# Patient Record
Sex: Female | Born: 1990 | Race: Black or African American | Hispanic: No | Marital: Single | Smoking: Former smoker
Health system: Southern US, Community
[De-identification: ages and names within clinical notes are randomized; demographics above are authoritative.]

## PROBLEM LIST (undated history)

## (undated) ENCOUNTER — Inpatient Hospital Stay (HOSPITAL_COMMUNITY): Payer: Self-pay

## (undated) DIAGNOSIS — O139 Gestational [pregnancy-induced] hypertension without significant proteinuria, unspecified trimester: Secondary | ICD-10-CM

## (undated) DIAGNOSIS — E119 Type 2 diabetes mellitus without complications: Secondary | ICD-10-CM

## (undated) DIAGNOSIS — M069 Rheumatoid arthritis, unspecified: Secondary | ICD-10-CM

## (undated) HISTORY — DX: Rheumatoid arthritis, unspecified: M06.9

---

## 2011-05-03 DIAGNOSIS — O139 Gestational [pregnancy-induced] hypertension without significant proteinuria, unspecified trimester: Secondary | ICD-10-CM

## 2011-05-03 HISTORY — DX: Gestational (pregnancy-induced) hypertension without significant proteinuria, unspecified trimester: O13.9

## 2013-08-24 ENCOUNTER — Encounter (HOSPITAL_COMMUNITY): Payer: Self-pay | Admitting: *Deleted

## 2013-08-24 ENCOUNTER — Inpatient Hospital Stay (HOSPITAL_COMMUNITY)
Admission: AD | Admit: 2013-08-24 | Discharge: 2013-08-24 | Disposition: A | Discharge status: Home / Self Care | Payer: Medicaid Other | Source: Ambulatory Visit | Attending: Obstetrics and Gynecology | Admitting: Obstetrics and Gynecology

## 2013-08-24 DIAGNOSIS — R109 Unspecified abdominal pain: Secondary | ICD-10-CM | POA: Insufficient documentation

## 2013-08-24 DIAGNOSIS — B373 Candidiasis of vulva and vagina: Secondary | ICD-10-CM

## 2013-08-24 DIAGNOSIS — O24012 Pre-existing diabetes mellitus, type 1, in pregnancy, second trimester: Secondary | ICD-10-CM

## 2013-08-24 DIAGNOSIS — O2390 Unspecified genitourinary tract infection in pregnancy, unspecified trimester: Secondary | ICD-10-CM | POA: Insufficient documentation

## 2013-08-24 DIAGNOSIS — E109 Type 1 diabetes mellitus without complications: Secondary | ICD-10-CM | POA: Insufficient documentation

## 2013-08-24 DIAGNOSIS — B3731 Acute candidiasis of vulva and vagina: Secondary | ICD-10-CM

## 2013-08-24 DIAGNOSIS — O24919 Unspecified diabetes mellitus in pregnancy, unspecified trimester: Secondary | ICD-10-CM | POA: Insufficient documentation

## 2013-08-24 HISTORY — DX: Type 2 diabetes mellitus without complications: E11.9

## 2013-08-24 HISTORY — DX: Gestational (pregnancy-induced) hypertension without significant proteinuria, unspecified trimester: O13.9

## 2013-08-24 LAB — URINALYSIS, ROUTINE W REFLEX MICROSCOPIC
Bilirubin Urine: NEGATIVE
GLUCOSE, UA: NEGATIVE mg/dL
HGB URINE DIPSTICK: NEGATIVE
Ketones, ur: NEGATIVE mg/dL
Nitrite: NEGATIVE
Protein, ur: NEGATIVE mg/dL
Urobilinogen, UA: 0.2 mg/dL (ref 0.0–1.0)
pH: 5.5 (ref 5.0–8.0)

## 2013-08-24 LAB — WET PREP, GENITAL
CLUE CELLS WET PREP: NONE SEEN
Trich, Wet Prep: NONE SEEN
Yeast Wet Prep HPF POC: NONE SEEN

## 2013-08-24 LAB — URINE MICROSCOPIC-ADD ON

## 2013-08-24 MED ORDER — TERCONAZOLE 0.4 % VA CREA: 1.0000 | TOPICAL_CREAM | Freq: Every day | VAGINAL | Status: DC

## 2013-08-24 MED ORDER — FLUCONAZOLE 150 MG PO TABS
150.0000 mg | ORAL_TABLET | Freq: Once | ORAL | Status: AC
  Administered 2013-08-24: 150 mg via ORAL
  Filled 2013-08-24: qty 1

## 2013-08-24 NOTE — Discharge Instructions (Signed)
Monilial Vaginitis Vaginitis in a soreness, swelling and redness (inflammation) of the vagina and vulva. Monilial vaginitis is not a sexually transmitted infection. CAUSES  Yeast vaginitis is caused by yeast (candida) that is normally found in your vagina. With a yeast infection, the candida has overgrown in number to a point that upsets the chemical balance. SYMPTOMS   White, thick vaginal discharge.  Swelling, itching, redness and irritation of the vagina and possibly the lips of the vagina (vulva).  Burning or painful urination.  Painful intercourse. DIAGNOSIS  Things that may contribute to monilial vaginitis are:  Postmenopausal and virginal states.  Pregnancy.  Infections.  Being tired, sick or stressed, especially if you had monilial vaginitis in the past.  Diabetes. Good control will help lower the chance.  Birth control pills.  Tight fitting garments.  Using bubble bath, feminine sprays, douches or deodorant tampons.  Taking certain medications that kill germs (antibiotics).  Sporadic recurrence can occur if you become ill. TREATMENT  Your caregiver will give you medication.  There are several kinds of anti monilial vaginal creams and suppositories specific for monilial vaginitis. For recurrent yeast infections, use a suppository or cream in the vagina 2 times a week, or as directed.  Anti-monilial or steroid cream for the itching or irritation of the vulva may also be used. Get your caregiver's permission.  Painting the vagina with methylene blue solution may help if the monilial cream does not work.  Eating yogurt may help prevent monilial vaginitis. HOME CARE INSTRUCTIONS   Finish all medication as prescribed.  Do not have sex until treatment is completed or after your caregiver tells you it is okay.  Take warm sitz baths.  Do not douche.  Do not use tampons, especially scented ones.  Wear cotton underwear.  Avoid tight pants and panty  hose.  Tell your sexual partner that you have a yeast infection. They should go to their caregiver if they have symptoms such as mild rash or itching.  Your sexual partner should be treated as well if your infection is difficult to eliminate.  Practice safer sex. Use condoms.  Some vaginal medications cause latex condoms to fail. Vaginal medications that harm condoms are:  Cleocin cream.  Butoconazole (Femstat).  Terconazole (Terazol) vaginal suppository.  Miconazole (Monistat) (may be purchased over the counter). SEEK MEDICAL CARE IF:   You have a temperature by mouth above 102 F (38.9 C).  The infection is getting worse after 2 days of treatment.  The infection is not getting better after 3 days of treatment.  You develop blisters in or around your vagina.  You develop vaginal bleeding, and it is not your menstrual period.  You have pain when you urinate.  You develop intestinal problems.  You have pain with sexual intercourse. Document Released: 01/14/2005 Document Revised: 06/29/2011 Document Reviewed: 09/28/2008 [facility] Patient Information 2014 [place], [place].  Abdominal Pain During Pregnancy Abdominal pain is common in pregnancy. Most of the time, it does not cause harm. There are many causes of abdominal pain. Some causes are more serious than others. Some of the causes of abdominal pain in pregnancy are easily diagnosed. Occasionally, the diagnosis takes time to understand. Other times, the cause is not determined. Abdominal pain can be a sign that something is very wrong with the pregnancy, or the pain may have nothing to do with the pregnancy at all. For this reason, always tell your health care provider if you have any abdominal discomfort. HOME CARE INSTRUCTIONS  Monitor your  abdominal pain for any changes. The following actions may help to alleviate any discomfort you are experiencing:  Do not have sexual intercourse or put anything in your vagina until  your symptoms go away completely.  Get plenty of rest until your pain improves.  Drink clear fluids if you feel nauseous. Avoid solid food as long as you are uncomfortable or nauseous.  Only take over-the-counter or prescription medicine as directed by your health care provider.  Keep all follow-up appointments with your health care provider. SEEK IMMEDIATE MEDICAL CARE IF:  You are bleeding, leaking fluid, or passing tissue from the vagina.  You have increasing pain or cramping.  You have persistent vomiting.  You have painful or bloody urination.  You have a fever.  You notice a decrease in your baby's movements.  You have extreme weakness or feel faint.  You have shortness of breath, with or without abdominal pain.  You develop a severe headache with abdominal pain.  You have abnormal vaginal discharge with abdominal pain.  You have persistent diarrhea.  You have abdominal pain that continues even after rest, or gets worse. MAKE SURE YOU:   Understand these instructions.  Will watch your condition.  Will get help right away if you are not doing well or get worse. Document Released: 04/06/2005 Document Revised: 01/25/2013 Document Reviewed: 11/03/2012 [facility] Patient Information 2014 [place], [place].

## 2013-08-24 NOTE — MAU Note (Addendum)
Pt reports having abd pain since this morning. Reports some vaginal burinng an irritation as well. Denies discharge. Reports having diarrhea x 2 days.

## 2013-08-24 NOTE — MAU Provider Note (Signed)
Chief Complaint: Abdominal Pain   First Provider Initiated Contact with Patient 08/24/13 1317     SUBJECTIVE HPI: Rebecca Mann is a 23 y.o. G2P0101 at [redacted]w[redacted]d by LMP who presents to maternity admissions reporting constant abdominal pain described as lower abdomen, like menstrual cramps, and vaginal itching x2 days.  She reports daily nausea with some vomiting, but this is unchanged.  She started care at [facility] but is transferring to [facility] for Type I GDM.  She reports her glucose has been well controlled at home.  She reports fetal movement, denies LOF, vaginal bleeding, vaginal itching/burning, urinary symptoms, h/a, dizziness, or fever/chills.     Past Medical History  Diagnosis Date  . Pregnancy induced hypertension   . Diabetes mellitus without complication    Past Surgical History  Procedure Laterality Date  . Cesarean section     History   Social History  . Marital Status: Single    Spouse Name: N/A    Number of Children: N/A  . Years of Education: N/A   Occupational History  . Not on file.   Social History Main Topics  . Smoking status: Never Smoker   . Smokeless tobacco: Not on file  . Alcohol Use: No  . Drug Use: No  . Sexual Activity: Yes    Birth Control/ Protection: None   Other Topics Concern  . Not on file   Social History Narrative  . No narrative on file   No current facility-administered medications on file prior to encounter.   No current outpatient prescriptions on file prior to encounter.   No Known Allergies  ROS: Pertinent items in HPI  OBJECTIVE Blood pressure 110/74, pulse 71, temperature 98.3 F (36.8 C), temperature source Oral, resp. rate 18, height 5\' 2"  (1.575 m), weight 61.236 kg (135 lb). GENERAL: Well-developed, well-nourished female in no acute distress.  HEENT: Normocephalic HEART: normal rate RESP: normal effort ABDOMEN: Soft, non-tender EXTREMITIES: Nontender, no edema NEURO: Alert and oriented Pelvic exam: Cervix pink,  visually closed, without lesion, large amount thick white discharge clinging to cervix and vaginal walls, vaginal walls and external genitalia with mild erythema, tender to exam Bimanual exam: Cervix 0/long/high, firm, anterior, neg CMT, uterus nontender, fundus 1cm below umbilicus, adnexa without tenderness, enlargement, or mass  FHT 172 by doppler  LAB RESULTS Results for orders placed during the hospital encounter of 08/24/13 (from the past 24 hour(s))  URINALYSIS, ROUTINE W REFLEX MICROSCOPIC     Status: Abnormal   Collection Time    08/24/13 12:23 PM      Result Value Ref Range   Color, Urine YELLOW  YELLOW   APPearance CLEAR  CLEAR   Specific Gravity, Urine <1.005 (*) 1.005 - 1.030   pH 5.5  5.0 - 8.0   Glucose, UA NEGATIVE  NEGATIVE mg/dL   Hgb urine dipstick NEGATIVE  NEGATIVE   Bilirubin Urine NEGATIVE  NEGATIVE   Ketones, ur NEGATIVE  NEGATIVE mg/dL   Protein, ur NEGATIVE  NEGATIVE mg/dL   Urobilinogen, UA 0.2  0.0 - 1.0 mg/dL   Nitrite NEGATIVE  NEGATIVE   Leukocytes, UA SMALL (*) NEGATIVE  URINE MICROSCOPIC-ADD ON     Status: Abnormal   Collection Time    08/24/13 12:23 PM      Result Value Ref Range   Squamous Epithelial / LPF FEW (*) RARE   WBC, UA 3-6  <3 WBC/hpf   RBC / HPF 0-2  <3 RBC/hpf  WET PREP, GENITAL     Status: Abnormal  Collection Time    08/24/13  1:36 PM      Result Value Ref Range   Yeast Wet Prep HPF POC NONE SEEN  NONE SEEN   Trich, Wet Prep NONE SEEN  NONE SEEN   Clue Cells Wet Prep HPF POC NONE SEEN  NONE SEEN   WBC, Wet Prep HPF POC MANY (*) NONE SEEN   ASSESSMENT 1. Vaginal candidiasis   2. Type 1 diabetes mellitus complicating pregnancy in second trimester, antepartum     PLAN Diflucan 150 mg x1 dose in MAU Discharge home Terazol 7 nightly x7 nights Rx sent to pharmacy F/U with [facility] for transfer to [facility].  Pt given contact info for [facility] if needed. Return to MAU as needed for emergencies    Medication List         insulin  glargine 100 UNIT/ML injection  Commonly known as:  LANTUS  Inject 45 Units into the skin at bedtime.     insulin lispro 100 UNIT/ML injection  Commonly known as:  HUMALOG  Inject 15 Units into the skin 3 (three) times daily before meals.     OVER THE COUNTER MEDICATION  Place 2 drops into both ears daily as needed (pain).     prenatal multivitamin Tabs tablet  Take 1 tablet by mouth daily at 12 noon.     terconazole 0.4 % vaginal cream  Commonly known as:  TERAZOL 7  Place 1 applicator vaginally at bedtime.       Follow-up Information   Follow up with [facility]. (Your provider is transferring your care to the [facility] or call the number below to set up appointment.   )    Specialty:  Obstetrics and Gynecology   Contact information:   [address] [place] [address] [phone]      Follow up with THE [facility] [place] [facility]TR[facility]. (Ultrasound will call you to schedule appointment or call the number below.  Return to MAU as needed for emergencies.)    Specialty:  Radiology   Contact information:   [address] [id number] [place] [address] [phone]      Sharen Counter Certified Nurse-Midwife 08/24/2013  2:27 PM

## 2013-08-25 LAB — GC/CHLAMYDIA PROBE AMP
CT Probe RNA: NEGATIVE
GC PROBE AMP APTIMA: NEGATIVE

## 2013-08-25 LAB — URINE CULTURE
COLONY COUNT: NO GROWTH
Culture: NO GROWTH

## 2013-08-25 NOTE — MAU Provider Note (Signed)
Attestation of Attending Supervision of Advanced Practitioner (CNM/NP): Evaluation and management procedures were performed by the Advanced Practitioner under my supervision and collaboration.  I have reviewed the Advanced Practitioner's note and chart, and I agree with the management and plan.  Tyger Wichman 08/25/2013 6:21 AM

## 2013-08-28 ENCOUNTER — Other Ambulatory Visit (HOSPITAL_COMMUNITY): Payer: Self-pay | Admitting: Physician Assistant

## 2013-08-28 DIAGNOSIS — Z3689 Encounter for other specified antenatal screening: Secondary | ICD-10-CM

## 2013-08-28 LAB — OB RESULTS CONSOLE VARICELLA ZOSTER ANTIBODY, IGG: Varicella: IMMUNE

## 2013-08-28 LAB — OB RESULTS CONSOLE PLATELET COUNT: Platelets: 234 10*3/uL

## 2013-08-28 LAB — OB RESULTS CONSOLE ANTIBODY SCREEN: Antibody Screen: NEGATIVE

## 2013-08-28 LAB — OB RESULTS CONSOLE GC/CHLAMYDIA
Chlamydia: NEGATIVE
GC PROBE AMP, GENITAL: NEGATIVE

## 2013-08-28 LAB — OB RESULTS CONSOLE ABO/RH: RH TYPE: POSITIVE

## 2013-08-28 LAB — OB RESULTS CONSOLE HIV ANTIBODY (ROUTINE TESTING): HIV: NONREACTIVE

## 2013-08-28 LAB — OB RESULTS CONSOLE HEPATITIS B SURFACE ANTIGEN: Hepatitis B Surface Ag: NEGATIVE

## 2013-08-28 LAB — CYTOLOGY - PAP: PAP SMEAR: NEGATIVE

## 2013-08-28 LAB — CYSTIC FIBROSIS DIAGNOSTIC STUDY: INTERPRETATION-CFDNA: NEGATIVE

## 2013-08-28 LAB — OB RESULTS CONSOLE HGB/HCT, BLOOD
HEMATOCRIT: 39 %
Hemoglobin: 12.3 g/dL

## 2013-08-28 LAB — OB RESULTS CONSOLE RUBELLA ANTIBODY, IGM: Rubella: IMMUNE

## 2013-08-28 LAB — OB RESULTS CONSOLE RPR: RPR: NONREACTIVE

## 2013-09-01 ENCOUNTER — Ambulatory Visit (HOSPITAL_COMMUNITY)
Admission: RE | Admit: 2013-09-01 | Discharge: 2013-09-01 | Disposition: A | Discharge status: Home / Self Care | Payer: Medicaid Other | Source: Ambulatory Visit | Attending: Physician Assistant | Admitting: Physician Assistant

## 2013-09-01 ENCOUNTER — Other Ambulatory Visit (HOSPITAL_COMMUNITY): Payer: Self-pay | Admitting: Physician Assistant

## 2013-09-01 DIAGNOSIS — Z3689 Encounter for other specified antenatal screening: Secondary | ICD-10-CM

## 2013-09-04 ENCOUNTER — Encounter: Payer: Self-pay | Admitting: Obstetrics & Gynecology

## 2013-09-04 ENCOUNTER — Telehealth: Payer: Self-pay | Admitting: Obstetrics & Gynecology

## 2013-09-04 NOTE — Telephone Encounter (Signed)
Called patient, but her mother answered the phone and said she was sleep. Mother will have patient call office when she wakes up.

## 2013-09-13 ENCOUNTER — Encounter: Payer: Self-pay | Admitting: *Deleted

## 2013-09-14 DIAGNOSIS — Z361 Encounter for antenatal screening for raised alphafetoprotein level: Secondary | ICD-10-CM | POA: Insufficient documentation

## 2013-09-15 ENCOUNTER — Encounter: Payer: Self-pay | Admitting: General Practice

## 2013-09-18 ENCOUNTER — Encounter: Payer: Medicaid Other | Attending: Obstetrics and Gynecology | Admitting: *Deleted

## 2013-09-18 ENCOUNTER — Ambulatory Visit (INDEPENDENT_AMBULATORY_CARE_PROVIDER_SITE_OTHER): Payer: Medicaid Other | Admitting: Obstetrics and Gynecology

## 2013-09-18 ENCOUNTER — Encounter: Payer: Self-pay | Admitting: Obstetrics and Gynecology

## 2013-09-18 VITALS — BP 99/60 | HR 88 | Temp 98.2°F | Ht 60.0 in | Wt 140.1 lb

## 2013-09-18 DIAGNOSIS — E119 Type 2 diabetes mellitus without complications: Secondary | ICD-10-CM

## 2013-09-18 DIAGNOSIS — O24919 Unspecified diabetes mellitus in pregnancy, unspecified trimester: Secondary | ICD-10-CM

## 2013-09-18 DIAGNOSIS — Z713 Dietary counseling and surveillance: Secondary | ICD-10-CM | POA: Diagnosis not present

## 2013-09-18 DIAGNOSIS — O0990 Supervision of high risk pregnancy, unspecified, unspecified trimester: Secondary | ICD-10-CM

## 2013-09-18 DIAGNOSIS — O09299 Supervision of pregnancy with other poor reproductive or obstetric history, unspecified trimester: Secondary | ICD-10-CM

## 2013-09-18 DIAGNOSIS — M069 Rheumatoid arthritis, unspecified: Secondary | ICD-10-CM | POA: Insufficient documentation

## 2013-09-18 DIAGNOSIS — O24119 Pre-existing diabetes mellitus, type 2, in pregnancy, unspecified trimester: Secondary | ICD-10-CM

## 2013-09-18 DIAGNOSIS — E109 Type 1 diabetes mellitus without complications: Secondary | ICD-10-CM | POA: Insufficient documentation

## 2013-09-18 DIAGNOSIS — O24019 Pre-existing diabetes mellitus, type 1, in pregnancy, unspecified trimester: Secondary | ICD-10-CM | POA: Insufficient documentation

## 2013-09-18 DIAGNOSIS — O34219 Maternal care for unspecified type scar from previous cesarean delivery: Secondary | ICD-10-CM

## 2013-09-18 DIAGNOSIS — Z23 Encounter for immunization: Secondary | ICD-10-CM

## 2013-09-18 LAB — COMPREHENSIVE METABOLIC PANEL
ALT: 10 U/L (ref 0–35)
AST: 11 U/L (ref 0–37)
Albumin: 3.8 g/dL (ref 3.5–5.2)
Alkaline Phosphatase: 45 U/L (ref 39–117)
BILIRUBIN TOTAL: 0.3 mg/dL (ref 0.2–1.2)
BUN: 6 mg/dL (ref 6–23)
CO2: 19 mEq/L (ref 19–32)
Calcium: 9.4 mg/dL (ref 8.4–10.5)
Chloride: 104 mEq/L (ref 96–112)
Creat: 0.43 mg/dL — ABNORMAL LOW (ref 0.50–1.10)
GLUCOSE: 127 mg/dL — AB (ref 70–99)
Potassium: 4.1 mEq/L (ref 3.5–5.3)
Sodium: 134 mEq/L — ABNORMAL LOW (ref 135–145)
Total Protein: 6.4 g/dL (ref 6.0–8.3)

## 2013-09-18 LAB — CBC
HCT: 35.5 % — ABNORMAL LOW (ref 36.0–46.0)
HEMOGLOBIN: 11.7 g/dL — AB (ref 12.0–15.0)
MCH: 22.2 pg — ABNORMAL LOW (ref 26.0–34.0)
MCHC: 33 g/dL (ref 30.0–36.0)
MCV: 67.2 fL — AB (ref 78.0–100.0)
PLATELETS: 252 10*3/uL (ref 150–400)
RBC: 5.28 MIL/uL — AB (ref 3.87–5.11)
RDW: 15.7 % — ABNORMAL HIGH (ref 11.5–15.5)
WBC: 8.2 10*3/uL (ref 4.0–10.5)

## 2013-09-18 LAB — POCT URINALYSIS DIP (DEVICE)
Bilirubin Urine: NEGATIVE
Glucose, UA: 250 mg/dL — AB
Hgb urine dipstick: NEGATIVE
KETONES UR: NEGATIVE mg/dL
LEUKOCYTES UA: NEGATIVE
Nitrite: NEGATIVE
PH: 7 (ref 5.0–8.0)
Protein, ur: NEGATIVE mg/dL
Specific Gravity, Urine: 1.02 (ref 1.005–1.030)
Urobilinogen, UA: 0.2 mg/dL (ref 0.0–1.0)

## 2013-09-18 LAB — TSH: TSH: 2.776 u[IU]/mL (ref 0.350–4.500)

## 2013-09-18 MED ORDER — ACCU-CHEK FASTCLIX LANCETS MISC: 1.0000 | Freq: Four times a day (QID) | Status: AC

## 2013-09-18 MED ORDER — ASPIRIN 81 MG PO TABS: 81.0000 mg | ORAL_TABLET | Freq: Every day | ORAL | Status: AC

## 2013-09-18 MED ORDER — INSULIN GLARGINE 100 UNIT/ML ~~LOC~~ SOLN: 30.0000 [IU] | Freq: Every day | SUBCUTANEOUS | Status: DC

## 2013-09-18 MED ORDER — GLUCOSE BLOOD VI STRP: ORAL_STRIP | Status: DC

## 2013-09-18 NOTE — Progress Notes (Signed)
[facility]DM [facility] 2 years. Patient moved from [place] a few weeks ago. Prenatal care complicated by previous c-section as well- Patient is interested in [facility]. C-section at 34 weeks secondary to preeclampsia

## 2013-09-18 NOTE — Progress Notes (Signed)
DIABETES: Patient DX T1DM at age of 23yo. Is on Lantus 30 units HS and Novolog 15units with breakfast, lunch, dinner. [place] Previously utilizing Contour glucometer. Dispensed Accu-ck Nano Lot: [id number], Exp: 07/19/14. Will provide order for supplies.

## 2013-09-18 NOTE — Progress Notes (Signed)
Nutrition note: 1st visit consult/ DM education  Pt has Type 1 DM Pt has gained 10.1# @ 23w, which is wnl. Pt reports eating 5 meals & 3 snacks/d. Pt has been checking blood sugars. Pt is taking a PNV. Pt reports having nausea and heartburn occ. Pt is walking most days. Pt received verbal & written review on DM diet during pregnancy.  Discussed wt gain goals of 25-35# or 1#/wk. Pt agrees to follow DM diet with 3 meals & 3 snacks/d with proper CHO/ protein combination. Pt has WIC & plans to BF. F/u in 4-6 wks Blondell Reveal, MS, RD, LDN

## 2013-09-18 NOTE — Progress Notes (Deleted)
Nutrition note: 1st visit/ DM education Pt has Type 1 DM

## 2013-09-18 NOTE — Progress Notes (Signed)
CBG fasting 48-108  Pp 65-169 (majority within range) on Lantus and Humolog regimen. Patient is not interested in changing regimen at this time as her sugars are well controlled. Will obtain baseline labs and schedule fetal echo, ophthalmology . Patient to meet with SW and nutritionist. Patient with h/o preeclampsia delivered at 34 weeks via c-section- will obtain op note. Recommended to start daily baby aspirin

## 2013-09-18 NOTE — Progress Notes (Signed)
Given new patient booklets.

## 2013-09-18 NOTE — Addendum Note (Signed)
Addended by: Rosendo Gros on: 09/18/2013 10:45 AM   Modules accepted: Orders

## 2013-09-21 LAB — PROTEIN, URINE, 24 HOUR
Protein, 24H Urine: 26 mg/d — ABNORMAL LOW (ref 50–100)
Protein, Urine: 4 mg/dL

## 2013-09-26 ENCOUNTER — Telehealth: Payer: Self-pay | Admitting: *Deleted

## 2013-09-26 DIAGNOSIS — O24119 Pre-existing diabetes mellitus, type 2, in pregnancy, unspecified trimester: Secondary | ICD-10-CM

## 2013-09-26 MED ORDER — GLUCOSE BLOOD VI STRP: ORAL_STRIP | Status: AC

## 2013-09-26 NOTE — Telephone Encounter (Signed)
Pt left message stating that she is having a problem getting her glucose strips from the pharmacy. I called pt and she said the pharmacy told her that the wrong strips were ordered. She has the [facility]. I re-ordered the strips with additional information and told pt to go to the pharmacy this evening. She should call us back tomorrow if there is still a problem.  Pt voiced understanding.

## 2013-09-28 ENCOUNTER — Other Ambulatory Visit: Payer: Self-pay | Admitting: Medical

## 2013-10-02 ENCOUNTER — Encounter: Payer: Self-pay | Admitting: Family Medicine

## 2013-10-02 ENCOUNTER — Ambulatory Visit (INDEPENDENT_AMBULATORY_CARE_PROVIDER_SITE_OTHER): Payer: Medicaid Other | Admitting: Family Medicine

## 2013-10-02 VITALS — BP 106/66 | HR 92 | Temp 98.0°F | Wt 141.8 lb

## 2013-10-02 DIAGNOSIS — O0990 Supervision of high risk pregnancy, unspecified, unspecified trimester: Secondary | ICD-10-CM

## 2013-10-02 DIAGNOSIS — N765 Ulceration of vagina: Secondary | ICD-10-CM

## 2013-10-02 DIAGNOSIS — O24919 Unspecified diabetes mellitus in pregnancy, unspecified trimester: Secondary | ICD-10-CM

## 2013-10-02 DIAGNOSIS — N7689 Other specified inflammation of vagina and vulva: Secondary | ICD-10-CM

## 2013-10-02 DIAGNOSIS — E119 Type 2 diabetes mellitus without complications: Secondary | ICD-10-CM

## 2013-10-02 DIAGNOSIS — N72 Inflammatory disease of cervix uteri: Secondary | ICD-10-CM

## 2013-10-02 DIAGNOSIS — O24119 Pre-existing diabetes mellitus, type 2, in pregnancy, unspecified trimester: Secondary | ICD-10-CM

## 2013-10-02 LAB — POCT URINALYSIS DIP (DEVICE)
BILIRUBIN URINE: NEGATIVE
Glucose, UA: 500 mg/dL — AB
Hgb urine dipstick: NEGATIVE
Ketones, ur: NEGATIVE mg/dL
NITRITE: NEGATIVE
Protein, ur: NEGATIVE mg/dL
Specific Gravity, Urine: 1.025 (ref 1.005–1.030)
Urobilinogen, UA: 0.2 mg/dL (ref 0.0–1.0)
pH: 6.5 (ref 5.0–8.0)

## 2013-10-02 NOTE — Progress Notes (Signed)
Feta Echo with Dr. Elizebeth Brooking scheduled 10/25/13 at 1030 am. am. [facility] Care appointment scheduled 11/02/13 at 11 15 am.

## 2013-10-02 NOTE — Patient Instructions (Signed)
Second Trimester of Pregnancy The second trimester is from week 13 through week 28, months 4 through 6. The second trimester is often a time when you feel your best. Your body has also adjusted to being pregnant, and you begin to feel better physically. Usually, morning sickness has lessened or quit completely, you may have more energy, and you may have an increase in appetite. The second trimester is also a time when the fetus is growing rapidly. At the end of the sixth month, the fetus is about 9 inches long and weighs about 1 pounds. You will likely begin to feel the baby move (quickening) between 18 and 20 weeks of the pregnancy. BODY CHANGES Your body goes through many changes during pregnancy. The changes vary from woman to woman.   Your weight will continue to increase. You will notice your lower abdomen bulging out.  You may begin to get stretch marks on your hips, abdomen, and breasts.  You may develop headaches that can be relieved by medicines approved by your caregiver.  You may urinate more often because the fetus is pressing on your bladder.  You may develop or continue to have heartburn as a result of your pregnancy.  You may develop constipation because certain hormones are causing the muscles that push waste through your intestines to slow down.  You may develop hemorrhoids or swollen, bulging veins (varicose veins).  You may have back pain because of the weight gain and pregnancy hormones relaxing your joints between the bones in your pelvis and as a result of a shift in weight and the muscles that support your balance.  Your breasts will continue to grow and be tender.  Your gums may bleed and may be sensitive to brushing and flossing.  Dark spots or blotches (chloasma, mask of pregnancy) may develop on your face. This will likely fade after the baby is born.  A dark line from your belly button to the pubic area (linea nigra) may appear. This will likely fade after the  baby is born. WHAT TO EXPECT AT YOUR PRENATAL VISITS During a routine prenatal visit:  You will be weighed to make sure you and the fetus are growing normally.  Your blood pressure will be taken.  Your abdomen will be measured to track your baby's growth.  The fetal heartbeat will be listened to.  Any test results from the previous visit will be discussed. Your caregiver may ask you:  How you are feeling.  If you are feeling the baby move.  If you have had any abnormal symptoms, such as leaking fluid, bleeding, severe headaches, or abdominal cramping.  If you have any questions. Other tests that may be performed during your second trimester include:  Blood tests that check for:  Low iron levels (anemia).  Gestational diabetes (between 24 and 28 weeks).  Rh antibodies.  Urine tests to check for infections, diabetes, or protein in the urine.  An ultrasound to confirm the proper growth and development of the baby.  An amniocentesis to check for possible genetic problems.  Fetal screens for spina bifida and Down syndrome. HOME CARE INSTRUCTIONS   Avoid all smoking, herbs, alcohol, and unprescribed drugs. These chemicals affect the formation and growth of the baby.  Follow your caregiver's instructions regarding medicine use. There are medicines that are either safe or unsafe to take during pregnancy.  Exercise only as directed by your caregiver. Experiencing uterine cramps is a good sign to stop exercising.  Continue to eat regular,   healthy meals.  Wear a good support bra for breast tenderness.  Do not use hot tubs, steam rooms, or saunas.  Wear your seat belt at all times when driving.  Avoid raw meat, uncooked cheese, cat litter boxes, and soil used by cats. These carry germs that can cause birth defects in the baby.  Take your prenatal vitamins.  Try taking a stool softener (if your caregiver approves) if you develop constipation. Eat more high-fiber foods,  such as fresh vegetables or fruit and whole grains. Drink plenty of fluids to keep your urine clear or pale yellow.  Take warm sitz baths to soothe any pain or discomfort caused by hemorrhoids. Use hemorrhoid cream if your caregiver approves.  If you develop varicose veins, wear support hose. Elevate your feet for 15 minutes, 3 4 times a day. Limit salt in your diet.  Avoid heavy lifting, wear low heel shoes, and practice good posture.  Rest with your legs elevated if you have leg cramps or low back pain.  Visit your dentist if you have not gone yet during your pregnancy. Use a soft toothbrush to brush your teeth and be gentle when you floss.  A sexual relationship may be continued unless your caregiver directs you otherwise.  Continue to go to all your prenatal visits as directed by your caregiver. SEEK MEDICAL CARE IF:   You have dizziness.  You have mild pelvic cramps, pelvic pressure, or nagging pain in the abdominal area.  You have persistent nausea, vomiting, or diarrhea.  You have a bad smelling vaginal discharge.  You have pain with urination. SEEK IMMEDIATE MEDICAL CARE IF:   You have a fever.  You are leaking fluid from your vagina.  You have spotting or bleeding from your vagina.  You have severe abdominal cramping or pain.  You have rapid weight gain or loss.  You have shortness of breath with chest pain.  You notice sudden or extreme swelling of your face, hands, ankles, feet, or legs.  You have not felt your baby move in over an hour.  You have severe headaches that do not go away with medicine.  You have vision changes. Document Released: 03/31/2001 Document Revised: 12/07/2012 Document Reviewed: 06/07/2012 [employer] Patient Information 2014 [employer].  

## 2013-10-02 NOTE — Progress Notes (Addendum)
Pt does not have her glucose log - difficulty with finding strips. Pt will return in 1 week with glucose log. 1 reading of 242 but no additional data Pt on lantus 30U qhs and humalog 15 qac.  If requiring change will increase lantus and split to BID (20U BID instead of 40 QHS), and adjust QAC dosing as well. Will not change insulin at this time.  +FM, no lof, no vb, no ctx Vaginal burning and tenderness Vaginal exam: healing ulcer lesion on bilateral labia. 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 Will draw HSV titre as well.  Rebecca Mann is a 23 y.o. G2P0101 at [redacted]w[redacted]d here for ROB visit.  F/u 1 week to review sugars  Discussed with Patient:  -Plans to breast feed.  All questions answered. -Continue prenatal vitamins. -Reviewed fetal kick counts (Pt to perform daily at a time when the baby is active, lie laterally with both hands on belly in quiet room and count all movements (hiccups, shoulder rolls, obvious kicks, etc); pt is to report to clinic or MAU for less than 10 movements felt in a one hour time period-pt told as soon as she counts 10 movements the count is complete.)  - Routine precautions discussed (depression, infection s/s).   Patient provided with all pertinent phone numbers for emergencies. - RTC for any VB, regular, painful cramps/ctxs occurring at a rate of >2/10 min, fever (100.5 or higher), n/v/d, any pain that is unresolving or worsening, LOF, decreased fetal movement, CP, SOB, edema  Problems: Patient Active Problem List   Diagnosis Date Noted  . Previous cesarean delivery, delivered, with or without mention of antepartum condition 09/18/2013  . Type 2 diabetes mellitus complicating pregnancy, antepartum 09/18/2013  . Type 2 diabetes mellitus 09/18/2013  . Rheumatoid arthritis 09/18/2013  . Supervision of high-risk pregnancy 09/18/2013  . Hx of preeclampsia, prior pregnancy, currently pregnant 09/18/2013  . Need for maternal serum alpha-protein (MSAFP)  screening 09/14/2013    To Do: HSV culture and Antibodies - if positive will treat after 35wk  [ ]  BCM: mirena  Edu: [x ] PTL precautions; [ ]  BF class; [ ]  childbirth class; [ ]   BF counseling;

## 2013-10-02 NOTE — Progress Notes (Signed)
Reports d/c with itch and odor as well as burning with urination; reports lower pelvic pain

## 2013-10-04 ENCOUNTER — Encounter (HOSPITAL_COMMUNITY): Payer: Self-pay | Admitting: *Deleted

## 2013-10-04 ENCOUNTER — Encounter: Payer: Self-pay | Admitting: *Deleted

## 2013-10-04 ENCOUNTER — Inpatient Hospital Stay (HOSPITAL_COMMUNITY)
Admission: AD | Admit: 2013-10-04 | Discharge: 2013-10-04 | Disposition: A | Discharge status: Home / Self Care | Payer: Medicaid Other | Source: Ambulatory Visit | Attending: Obstetrics & Gynecology | Admitting: Obstetrics & Gynecology

## 2013-10-04 DIAGNOSIS — B373 Candidiasis of vulva and vagina: Secondary | ICD-10-CM

## 2013-10-04 DIAGNOSIS — B3731 Acute candidiasis of vulva and vagina: Secondary | ICD-10-CM | POA: Insufficient documentation

## 2013-10-04 DIAGNOSIS — E119 Type 2 diabetes mellitus without complications: Secondary | ICD-10-CM | POA: Insufficient documentation

## 2013-10-04 DIAGNOSIS — O2390 Unspecified genitourinary tract infection in pregnancy, unspecified trimester: Secondary | ICD-10-CM | POA: Insufficient documentation

## 2013-10-04 DIAGNOSIS — O24919 Unspecified diabetes mellitus in pregnancy, unspecified trimester: Secondary | ICD-10-CM | POA: Insufficient documentation

## 2013-10-04 DIAGNOSIS — O24119 Pre-existing diabetes mellitus, type 2, in pregnancy, unspecified trimester: Secondary | ICD-10-CM

## 2013-10-04 DIAGNOSIS — Z87891 Personal history of nicotine dependence: Secondary | ICD-10-CM | POA: Insufficient documentation

## 2013-10-04 DIAGNOSIS — Z794 Long term (current) use of insulin: Secondary | ICD-10-CM | POA: Insufficient documentation

## 2013-10-04 LAB — URINALYSIS, ROUTINE W REFLEX MICROSCOPIC
Bilirubin Urine: NEGATIVE
Glucose, UA: >1000 mg/dL — AB
Hgb urine dipstick: NEGATIVE
Ketones, ur: NEGATIVE mg/dL
LEUKOCYTES UA: NEGATIVE
Nitrite: NEGATIVE
PROTEIN: NEGATIVE mg/dL
Specific Gravity, Urine: 1.02 (ref 1.005–1.030)
UROBILINOGEN UA: 0.2 mg/dL (ref 0.0–1.0)
pH: 6 (ref 5.0–8.0)

## 2013-10-04 LAB — HERPES SIMPLEX VIRUS CULTURE: ORGANISM ID, BACTERIA: NOT DETECTED

## 2013-10-04 LAB — GLUCOSE, CAPILLARY: GLUCOSE-CAPILLARY: 195 mg/dL — AB (ref 70–99)

## 2013-10-04 LAB — URINE MICROSCOPIC-ADD ON

## 2013-10-04 MED ORDER — BENZOCAINE-MENTHOL 20-0.5 % EX AERO
1.0000 "application " | INHALATION_SPRAY | Freq: Once | CUTANEOUS | Status: AC
  Administered 2013-10-04: 1 via TOPICAL
  Filled 2013-10-04: qty 56

## 2013-10-04 MED ORDER — TRIAMCINOLONE ACETONIDE 0.1 % EX CREA
TOPICAL_CREAM | Freq: Once | CUTANEOUS | Status: AC
  Administered 2013-10-04: 07:00:00 via TOPICAL
  Filled 2013-10-04: qty 15

## 2013-10-04 MED ORDER — TERCONAZOLE 0.4 % VA CREA: 1.0000 | TOPICAL_CREAM | Freq: Every day | VAGINAL | Status: DC

## 2013-10-04 MED ORDER — FLUCONAZOLE 150 MG PO TABS
150.0000 mg | ORAL_TABLET | Freq: Once | ORAL | Status: AC
  Administered 2013-10-04: 150 mg via ORAL
  Filled 2013-10-04: qty 1

## 2013-10-04 NOTE — Discharge Instructions (Signed)
Candidal Vulvovaginitis Candidal vulvovaginitis is an infection of the vagina and vulva. The vulva is the skin around the opening of the vagina. This may cause itching and discomfort in and around the vagina.  HOME CARE  Only take medicine as told by your doctor.  Do not have sex (intercourse) until the infection is healed or as told by your doctor.  Practice safe sex.  Tell your sex partner about your infection.  Do not douche or use tampons.  Wear cotton underwear. Do not wear tight pants or panty hose.  Eat yogurt. This may help treat and prevent yeast infections. GET HELP RIGHT AWAY IF:   You have a fever.  Your problems get worse during treatment or do not get better in 3 days.  You have discomfort, irritation, or itching in your vagina or vulva area.  You have pain after sex.  You start to get belly (abdominal) pain. MAKE SURE YOU:  Understand these instructions.  Will watch your condition.  Will get help right away if you are not doing well or get worse. Document Released: 07/03/2008 Document Revised: 04/11/2013 Document Reviewed: 07/03/2008 [facility] Patient Information 2015 [place], [place]. This information is not intended to replace advice given to you by your health care provider. Make sure you discuss any questions you have with your health care provider.  Type 1 or Type 2 Diabetes Mellitus During Pregnancy Diabetes mellitus, often simply referred to as diabetes, is a long-term (chronic) disease. Type 1 diabetes occurs when the islet cells in the pancreas that make insulin (a hormone) are destroyed and can no longer make insulin. Type 2 diabetes occurs when the pancreas does not make enough insulin, the cells are less responsive to the insulin that is made (insulin resistance), or both. Insulin is needed to move sugars from food into the tissue cells. The tissue cells use the sugars for energy. The lack of insulin or the lack of normal response to insulin causes  excess sugars to build up in the blood instead of going into the tissue cells. As a result, high blood sugar (hyperglycemia) develops.  If blood glucose levels are kept in the normal range both before and during pregnancy, women can have a healthy pregnancy. If your blood glucose levels are not well controlled, there may be risks to you, your unborn baby (fetus), your labor and delivery, or your newborn baby.  RISK FACTORS  You are predisposed to developing type 1 diabetes if someone in your family has diabetes and you are exposed to certain environmental triggers.  You have an increased chance of developing type 2 diabetes if you have a family history of diabetes and also have one or more of the following risk factors:  Being overweight.  Having an inactive lifestyle.  Having a history of consistently eating high-calorie foods. SYMPTOMS The symptoms of diabetes include:  Increased thirst (polydipsia).  Increased urination (polyuria).  Increased urination during the night (nocturia).  Weight loss. This weight loss may be rapid.  Frequent, recurring infections.  Tiredness (fatigue).  Weakness.  Vision changes, such as blurred vision.  Fruity smell to your breath.  Abdominal pain.  Nausea or vomiting. DIAGNOSIS  Diabetes is diagnosed when blood glucose levels are increased. Your blood glucose level may be checked by one or more of the following blood tests:  A fasting blood glucose test. You will not be allowed to eat for at least 8 hours before a blood sample is taken.  A random blood glucose test. Your  blood glucose is checked at any time of the day regardless of when you ate.  A hemoglobin A1c blood glucose test. A hemoglobin A1c test provides information about blood glucose control over the previous 3 months.  An oral glucose tolerance test (OGTT). Your blood glucose is measured after you have not eaten (fasted) for 1 3 hours and then after you drink a  glucose-containing beverage. An OGTT is usually performed during weeks 24 28 of your pregnancy. TREATMENT   You will need to take diabetes medicine or insulin daily to keep blood glucose levels in the desired range.  You will need to match insulin dosing with exercise and healthy food choices. The treatment goal is to maintain the before-meal (preprandial), bedtime, and overnight blood glucose level at 60 99 mg/dL during pregnancy. The treatment goal is to further maintain the peak after-meal blood sugar (postprandial glucose) level at 100 140 mg/dL.  HOME CARE INSTRUCTIONS   Have your hemoglobin A1c level checked twice a year.  Perform daily blood glucose monitoring as directed by your caregiver. It is common to perform frequent blood glucose monitoring.  Monitor urine ketones when you are ill and as directed by your caregiver.  Take your diabetes medicine and insulin as directed by your caregiver to maintain your blood glucose level in the desired range.  Never run out of diabetes medicine or insulin. It is needed every day.  Adjust insulin based on your intake of carbohydrates. Carbohydrates can raise blood glucose levels but need to be included in your diet. Carbohydrates provide vitamins, minerals, and fiber, which are an essential part of a healthy diet. Carbohydrates are found in fruits, vegetables, whole grains, dairy products, legumes, and foods containing added sugars.  Eat healthy foods. Alternate 3 meals with 3 snacks.  Maintain a healthy weight gain. The usual total expected weight gain varies according to your prepregnancy body mass index (BMI).  Carry a medical alert card or wear medical alert jewelry.  Carry a 15 gram carbohydrate snack with you at all times to treat low blood sugar (hypoglycemia). Some examples of 15 gram carbohydrate snacks include:  Glucose tablets, 3 or 4.  Glucose gel, 15 gram tube.  Raisins, 2 tablespoons (24 grams).  Jelly beans,  6.  Animal crackers, 8.  Fruit juice, regular soda, or low fat milk, 4 ounces (120 mL).  Gummy treats, 9.  Recognize hypoglycemia. Hypoglycemia during pregnancy occurs with blood glucose levels of 60 mg/dL and below. The risk for hypoglycemia increases when fasting or skipping meals, during or after intense exercise, and during sleep. Hypoglycemia symptoms can include:  Tremors or shakes.  Decreased ability to concentrate.  Sweating.  Increased heart rate.  Headache.  Dry mouth.  Hunger.  Irritability.  Anxiety.  Restless sleep.  Altered speech or coordination.  Confusion.  Treat hypoglycemia promptly. If you are alert and able to safely swallow, follow the 15:15 rule:  Take 15 20 grams of rapid-acting glucose or carbohydrate. Rapid-acting options include glucose gel, glucose tablets, or 4 ounces (120 mL) of fruit juice, regular soda, or low-fat milk.  Check your blood glucose level 15 minutes after taking the glucose.  Take 15 20 grams more of glucose if the repeat blood glucose level is still 70 mg/dL or below.  Eat a meal or snack within 1 hour once blood glucose levels return to normal.  Engage in at least 30 minutes of physical activity a day or as directed by your caregiver. Ten minutes of physical activity timed  30 minutes after each meal is encouraged to control postprandial blood glucose levels.   Be alert to polyuria and polydipsia, which are early signs of hyperglycemia. An early awareness of hyperglycemia allows for prompt treatment. Treat hyperglycemia as directed by your caregiver.  Adjust your insulin dosing and food intake as needed if you start a new exercise or sport.  Follow your sick day plan at any time you are unable to eat or drink as usual.  Avoid tobacco and alcohol use.  Follow up with your caregiver regularly.  Follow the advice of your caregiver regarding your prenatal and post-delivery (postpartum) appointments, meal planning,  exercise, medicines, vitamins, blood tests, other medical tests, and physical activities.  Continue daily skin and foot care. Examine your skin and feet daily for cuts, bruises, redness, nail problems, bleeding, blisters, or sores. A foot exam by a caregiver should be done annually.  Brush your teeth and gums at least twice a day and floss at least once a day. Follow up with your dentist regularly.  Schedule an eye exam during the first trimester of your pregnancy or as directed by your caregiver.  Share your diabetes management plan with your workplace or school.  Stay up-to-date with immunizations.  Learn to manage stress.  Obtain ongoing diabetes education and support as needed. SEEK MEDICAL CARE IF:   You are unable to eat food or drink fluids for more than 6 hours.  You have nausea and vomiting for more than 6 hours.  You have a blood glucose level of 200 mg/dL and you have ketones in your urine.  There is a change in mental status.  You develop vision problems.  You have a persistent headache.  You have upper abdominal pain or discomfort.  You develop an additional serious illness.  You have diarrhea for more than 6 hours.  You have been sick or have had a fever for a couple of days and are not getting better. SEEK IMMEDIATE MEDICAL CARE IF:  You have difficulty breathing.  You no longer feel the baby moving.  You are bleeding or have discharge from your vagina.  You start having premature contractions or labor. MAKE SURE YOU:  Understand these instructions.  Will watch your condition.  Will get help right away if you are not doing well or get worse. Document Released: 12/30/2011 Document Revised: 03/23/2012 Document Reviewed: 12/30/2011 [facility] Patient Information 2014 [place], [place].

## 2013-10-04 NOTE — MAU Provider Note (Signed)
Chief Complaint:  No chief complaint on file.  First Provider Initiated Contact with Patient 10/04/13 [phone]     HPI: Rebecca Mann is a 23 y.o. G2P0101 at [redacted]w[redacted]d who presents to maternity admissions reporting intense vaginal itching and excoriation. Dx w/ yeast infection at prenatal appt 2 days ago. Given Diflucan. Supposed to have Rx sent to pharmacy for cream, but pharmacy didn't get Rx. HSV cultures collected 10/02/2013, pending.  Denies contractions, leakage of fluid or vaginal bleeding. Good fetal movement.   Has IDDM Dx 2 years ago. Taking insulin as directed, last dose Lantus 30 U last night. Did not bring log to last appt. Probably needs insulin adjustment, but Dr. Ike Bene unable to w/out CBGs.   Pregnancy Course: IDDM  Past Medical History: Past Medical History  Diagnosis Date  . Pregnancy induced hypertension   . Diabetes mellitus without complication   . Rheumatoid arthritis     Past obstetric history: OB History  Gravida Para Term Preterm AB SAB TAB Ectopic Multiple Living  2 1  1      1     # Outcome Date GA Lbr Len/2nd Weight Sex Delivery Anes PTL Lv  2 CUR           1 PRE 05/03/11 [redacted]w[redacted]d  3.289 kg (7 lb 4 oz)  CS        Comments: juvenille diabetes, yeast infection, LGA, induced labor, UTI       Past Surgical History: Past Surgical History  Procedure Laterality Date  . Cesarean section       Family History: Family History  Problem Relation Age of Onset  . Hypertension Mother   . Diabetes Mother   . Diabetes Brother   . Diabetes Maternal Grandmother   . Diabetes Maternal Grandfather     Social History: History  Substance Use Topics  . Smoking status: Former Smoker    Quit date: 09/19/2011  . Smokeless tobacco: Never Used  . Alcohol Use: No    Allergies:  Allergies  Allergen Reactions  . Fish Allergy     Meds:  Prescriptions prior to admission  Medication Sig Dispense Refill  . aspirin 81 MG tablet Take 1 tablet (81 mg total) by mouth daily.  30  tablet  4  . Prenatal Vit-Fe Fumarate-FA (PRENATAL MULTIVITAMIN) TABS tablet Take 1 tablet by mouth daily at 12 noon.      11/19/2011 ACCU-CHEK FASTCLIX LANCETS MISC Inject 1 each into the skin 4 (four) times daily. Marland Kitchen for testing 4 times daily  102 each  12  . glucose blood test strip Use as instructed to test blood sugar 4 times daily.  50 each  12  . insulin glargine (LANTUS) 100 UNIT/ML injection Inject 0.3 mLs (30 Units total) into the skin at bedtime.  10 mL  6  . insulin lispro (HUMALOG) 100 UNIT/ML injection Inject 15 Units into the skin 3 (three) times daily before meals.      025.42 OVER THE COUNTER MEDICATION Place 2 drops into both ears daily as needed (pain).        ROS: Pertinent findings in history of present illness.  Physical Exam  Blood pressure 119/70, pulse 93, temperature 98.9 F (37.2 C), temperature source Oral, resp. rate 20, height 5' (1.524 m), weight 64.921 kg (143 lb 2 oz), last menstrual period 04/10/2013. GENERAL: Well-developed, well-nourished female in mild distress.  HEENT: normocephalic HEART: normal rate RESP: normal effort ABDOMEN: Soft, non-tender, gravid appropriate for gestational age EXTREMITIES: Nontender, no edema NEURO:  alert and oriented PELVIC EXAM: NEFG except for  Erythema, skin split and excoriation where labia minora and labia majora meet. Also has approximately #12 1 mm pale spots versus ulcers on lower labia majora bilaterally. no blood.     FHT:  Baseline 155 , moderate variability, accelerations present, no decelerations Contractions: none   Labs: Results for orders placed during the hospital encounter of 10/04/13 (from the past 24 hour(s))  URINALYSIS, ROUTINE W REFLEX MICROSCOPIC     Status: Abnormal   Collection Time    10/04/13  6:06 AM      Result Value Ref Range   Color, Urine YELLOW  YELLOW   APPearance CLEAR  CLEAR   Specific Gravity, Urine 1.020  1.005 - 1.030   pH 6.0  5.0 - 8.0   Glucose, UA >1000 (*) NEGATIVE mg/dL   Hgb  urine dipstick NEGATIVE  NEGATIVE   Bilirubin Urine NEGATIVE  NEGATIVE   Ketones, ur NEGATIVE  NEGATIVE mg/dL   Protein, ur NEGATIVE  NEGATIVE mg/dL   Urobilinogen, UA 0.2  0.0 - 1.0 mg/dL   Nitrite NEGATIVE  NEGATIVE   Leukocytes, UA NEGATIVE  NEGATIVE  URINE MICROSCOPIC-ADD ON     Status: None   Collection Time    10/04/13  6:06 AM      Result Value Ref Range   Squamous Epithelial / LPF RARE  RARE   WBC, UA 0-2  <3 WBC/hpf  GLUCOSE, CAPILLARY     Status: Abnormal   Collection Time    10/04/13  6:19 AM      Result Value Ref Range   Glucose-Capillary 195 (*) 70 - 99 mg/dL    Imaging:  No results found.  MAU Course: Diflucan and transvaginal and given in maternity admissions. Feeling much better.  Assessment: 1. Type 2 diabetes mellitus complicating pregnancy, antepartum   2. Vulvovaginal candidiasis     Plan: Discharge home in stable condition. Lengthy conversation with patient about increased insulin needs as pregnancy progresses and a strong relationship between uncontrolled diabetes and yeast infections (and stillbirth, macrosomia, fetal lung immaturity and neonatal hypoglycemia). Strongly urged patient to check blood sugars as instructed, document all blood sugars in her log and bring her log to her appointment every time. Preterm labor precautions and fetal kick counts.    Follow-up Information   Follow up with [facility] On 10/09/2013. (As scheduled)    Specialty:  Obstetrics and Gynecology   Contact information:   [address] [place] [address] [phone]      Follow up with [facility] [place] [facility] ADMISS[facility]. (As needed in emergencies)    Contact information:   [address] [id number] [place] [address] [phone]       Medication List         ACCU-CHEK FASTCLIX LANCETS Misc  Inject 1 each into the skin 4 (four) times daily. 595.63 for testing 4 times daily     aspirin 81 MG tablet  Take 1  tablet (81 mg total) by mouth daily.     glucose blood test strip  Use as instructed to test blood sugar 4 times daily.     insulin glargine 100 UNIT/ML injection  Commonly known as:  LANTUS  Inject 0.3 mLs (30 Units total) into the skin at bedtime.     insulin lispro 100 UNIT/ML injection  Commonly known as:  HUMALOG  Inject 15 Units into the skin 3 (three) times daily before meals.  OVER THE COUNTER MEDICATION  Place 2 drops into both ears daily as needed (pain).     prenatal multivitamin Tabs tablet  Take 1 tablet by mouth daily at 12 noon.     terconazole 0.4 % vaginal cream  Commonly known as:  TERAZOL 7  Place 1 applicator vaginally at bedtime.        [place], [place] 10/04/2013 6:32 AM

## 2013-10-04 NOTE — MAU Note (Signed)
PT SAYS SHE HAS ITCHING  IN HER VAGINA-  GETS [facility]- DOWNSTAIRS-     ON Monday- WAS TOLD HAD YEAST INFECTION   - GAVE DIFLUCAN-  IN CLINIC.  WAS SUPPOSE TO  CALL IN CREAM-  BUT FAMILY SAYS IT WAS NOT AT PHARMACY.    HAS   SCRATCH SO MUCH  -  OPEN  AND BURNING.    DENIES UC.      DENIES HSV AND MRSA.

## 2013-10-06 NOTE — MAU Provider Note (Signed)

## 2013-10-16 ENCOUNTER — Ambulatory Visit (INDEPENDENT_AMBULATORY_CARE_PROVIDER_SITE_OTHER): Payer: Medicaid Other | Admitting: Family Medicine

## 2013-10-16 VITALS — BP 111/67 | HR 96 | Temp 98.3°F | Wt 143.7 lb

## 2013-10-16 DIAGNOSIS — O0990 Supervision of high risk pregnancy, unspecified, unspecified trimester: Secondary | ICD-10-CM

## 2013-10-16 DIAGNOSIS — Z23 Encounter for immunization: Secondary | ICD-10-CM

## 2013-10-16 DIAGNOSIS — O34219 Maternal care for unspecified type scar from previous cesarean delivery: Secondary | ICD-10-CM

## 2013-10-16 DIAGNOSIS — O0992 Supervision of high risk pregnancy, unspecified, second trimester: Secondary | ICD-10-CM

## 2013-10-16 DIAGNOSIS — O24919 Unspecified diabetes mellitus in pregnancy, unspecified trimester: Secondary | ICD-10-CM

## 2013-10-16 DIAGNOSIS — E119 Type 2 diabetes mellitus without complications: Secondary | ICD-10-CM

## 2013-10-16 DIAGNOSIS — O09292 Supervision of pregnancy with other poor reproductive or obstetric history, second trimester: Secondary | ICD-10-CM

## 2013-10-16 DIAGNOSIS — O24112 Pre-existing diabetes mellitus, type 2, in pregnancy, second trimester: Secondary | ICD-10-CM

## 2013-10-16 DIAGNOSIS — O09299 Supervision of pregnancy with other poor reproductive or obstetric history, unspecified trimester: Secondary | ICD-10-CM

## 2013-10-16 LAB — CBC
HCT: 34.4 % — ABNORMAL LOW (ref 36.0–46.0)
Hemoglobin: 11.2 g/dL — ABNORMAL LOW (ref 12.0–15.0)
MCH: 22.2 pg — ABNORMAL LOW (ref 26.0–34.0)
MCHC: 32.6 g/dL (ref 30.0–36.0)
MCV: 68.3 fL — AB (ref 78.0–100.0)
Platelets: 238 10*3/uL (ref 150–400)
RBC: 5.04 MIL/uL (ref 3.87–5.11)
RDW: 15.1 % (ref 11.5–15.5)
WBC: 8 10*3/uL (ref 4.0–10.5)

## 2013-10-16 LAB — POCT URINALYSIS DIP (DEVICE)
Bilirubin Urine: NEGATIVE
GLUCOSE, UA: 500 mg/dL — AB
Hgb urine dipstick: NEGATIVE
Leukocytes, UA: NEGATIVE
Nitrite: NEGATIVE
Protein, ur: NEGATIVE mg/dL
SPECIFIC GRAVITY, URINE: 1.015 (ref 1.005–1.030)
UROBILINOGEN UA: 0.2 mg/dL (ref 0.0–1.0)
pH: 6 (ref 5.0–8.0)

## 2013-10-16 MED ORDER — FLUCONAZOLE 150 MG PO TABS: 150.0000 mg | ORAL_TABLET | Freq: Once | ORAL | Status: DC

## 2013-10-16 MED ORDER — TETANUS-DIPHTH-ACELL PERTUSSIS 5-2.5-18.5 LF-MCG/0.5 IM SUSP
0.5000 mL | Freq: Once | INTRAMUSCULAR | Status: AC
  Administered 2013-10-16: 0.5 mL via INTRAMUSCULAR

## 2013-10-16 NOTE — Progress Notes (Signed)
S: 23 yo G2P0101 here for ROBV   Fasting- 101-114 2 hour: 140-242   O: see flowsheet  A/P DM- will need f/u for growth in 1 week. Increase lantus to 40U and cont humalog 15qAC.  If not in better control next week, may need to admit.  Yeast- will treat with diflucan

## 2013-10-16 NOTE — Patient Instructions (Signed)
Increase your lantus to 40U at bedtime

## 2013-10-16 NOTE — Progress Notes (Signed)
Pt reports itching in vagina, and reports that when she feels sick her sugars seem to be higher.

## 2013-10-17 LAB — HIV ANTIBODY (ROUTINE TESTING W REFLEX): HIV 1&2 Ab, 4th Generation: NONREACTIVE

## 2013-10-17 LAB — RPR

## 2013-10-20 ENCOUNTER — Other Ambulatory Visit: Payer: Self-pay

## 2013-10-23 ENCOUNTER — Observation Stay (HOSPITAL_COMMUNITY)
Admission: AD | Admit: 2013-10-23 | Discharge: 2013-10-25 | Disposition: A | Discharge status: Home / Self Care | Payer: Medicaid Other | Source: Ambulatory Visit | Attending: Family Medicine | Admitting: Family Medicine

## 2013-10-23 ENCOUNTER — Ambulatory Visit (INDEPENDENT_AMBULATORY_CARE_PROVIDER_SITE_OTHER): Payer: Medicaid Other | Admitting: Obstetrics & Gynecology

## 2013-10-23 VITALS — BP 102/75 | HR 90 | Temp 97.6°F | Wt 146.7 lb

## 2013-10-23 DIAGNOSIS — O34219 Maternal care for unspecified type scar from previous cesarean delivery: Secondary | ICD-10-CM

## 2013-10-23 DIAGNOSIS — O09293 Supervision of pregnancy with other poor reproductive or obstetric history, third trimester: Secondary | ICD-10-CM

## 2013-10-23 DIAGNOSIS — O0992 Supervision of high risk pregnancy, unspecified, second trimester: Secondary | ICD-10-CM

## 2013-10-23 DIAGNOSIS — O24919 Unspecified diabetes mellitus in pregnancy, unspecified trimester: Secondary | ICD-10-CM | POA: Diagnosis present

## 2013-10-23 DIAGNOSIS — E109 Type 1 diabetes mellitus without complications: Secondary | ICD-10-CM | POA: Insufficient documentation

## 2013-10-23 DIAGNOSIS — O09292 Supervision of pregnancy with other poor reproductive or obstetric history, second trimester: Secondary | ICD-10-CM

## 2013-10-23 DIAGNOSIS — O09299 Supervision of pregnancy with other poor reproductive or obstetric history, unspecified trimester: Secondary | ICD-10-CM

## 2013-10-23 DIAGNOSIS — O24013 Pre-existing diabetes mellitus, type 1, in pregnancy, third trimester: Secondary | ICD-10-CM

## 2013-10-23 DIAGNOSIS — O99810 Abnormal glucose complicating pregnancy: Secondary | ICD-10-CM | POA: Diagnosis present

## 2013-10-23 DIAGNOSIS — O24113 Pre-existing diabetes mellitus, type 2, in pregnancy, third trimester: Secondary | ICD-10-CM

## 2013-10-23 DIAGNOSIS — O0990 Supervision of high risk pregnancy, unspecified, unspecified trimester: Secondary | ICD-10-CM

## 2013-10-23 DIAGNOSIS — O24112 Pre-existing diabetes mellitus, type 2, in pregnancy, second trimester: Secondary | ICD-10-CM

## 2013-10-23 DIAGNOSIS — E119 Type 2 diabetes mellitus without complications: Secondary | ICD-10-CM

## 2013-10-23 LAB — COMPREHENSIVE METABOLIC PANEL
ALBUMIN: 2.8 g/dL — AB (ref 3.5–5.2)
ALK PHOS: 59 U/L (ref 39–117)
ALT: 12 U/L (ref 0–35)
ANION GAP: 12 (ref 5–15)
AST: 12 U/L (ref 0–37)
BUN: 7 mg/dL (ref 6–23)
CHLORIDE: 101 meq/L (ref 96–112)
CO2: 24 meq/L (ref 19–32)
CREATININE: 0.49 mg/dL — AB (ref 0.50–1.10)
Calcium: 8.8 mg/dL (ref 8.4–10.5)
GLUCOSE: 58 mg/dL — AB (ref 70–99)
Potassium: 3.4 mEq/L — ABNORMAL LOW (ref 3.7–5.3)
Sodium: 137 mEq/L (ref 137–147)
Total Protein: 6.4 g/dL (ref 6.0–8.3)

## 2013-10-23 LAB — CBC WITH DIFFERENTIAL/PLATELET
BASOS ABS: 0 10*3/uL (ref 0.0–0.1)
Basophils Relative: 0 % (ref 0–1)
Eosinophils Absolute: 0.2 10*3/uL (ref 0.0–0.7)
Eosinophils Relative: 3 % (ref 0–5)
HCT: 33.7 % — ABNORMAL LOW (ref 36.0–46.0)
Hemoglobin: 10.9 g/dL — ABNORMAL LOW (ref 12.0–15.0)
LYMPHS PCT: 30 % (ref 12–46)
Lymphs Abs: 2.4 10*3/uL (ref 0.7–4.0)
MCH: 22.4 pg — ABNORMAL LOW (ref 26.0–34.0)
MCHC: 32.3 g/dL (ref 30.0–36.0)
MCV: 69.2 fL — ABNORMAL LOW (ref 78.0–100.0)
Monocytes Absolute: 0.5 10*3/uL (ref 0.1–1.0)
Monocytes Relative: 7 % (ref 3–12)
NEUTROS ABS: 4.8 10*3/uL (ref 1.7–7.7)
NEUTROS PCT: 60 % (ref 43–77)
PLATELETS: 195 10*3/uL (ref 150–400)
RBC: 4.87 MIL/uL (ref 3.87–5.11)
RDW: 13.7 % (ref 11.5–15.5)
WBC: 7.9 10*3/uL (ref 4.0–10.5)

## 2013-10-23 LAB — POCT URINALYSIS DIP (DEVICE)
BILIRUBIN URINE: NEGATIVE
Glucose, UA: 500 mg/dL — AB
Ketones, ur: NEGATIVE mg/dL
NITRITE: NEGATIVE
PH: 6 (ref 5.0–8.0)
Protein, ur: NEGATIVE mg/dL
SPECIFIC GRAVITY, URINE: 1.025 (ref 1.005–1.030)
Urobilinogen, UA: 0.2 mg/dL (ref 0.0–1.0)

## 2013-10-23 LAB — GLUCOSE, CAPILLARY
GLUCOSE-CAPILLARY: 55 mg/dL — AB (ref 70–99)
GLUCOSE-CAPILLARY: 117 mg/dL — AB (ref 70–99)
Glucose-Capillary: 115 mg/dL — ABNORMAL HIGH (ref 70–99)
Glucose-Capillary: 146 mg/dL — ABNORMAL HIGH (ref 70–99)

## 2013-10-23 MED ORDER — ACETAMINOPHEN 325 MG PO TABS
650.0000 mg | ORAL_TABLET | ORAL | Status: DC | PRN
  Administered 2013-10-24 – 2013-10-25 (×3): 650 mg via ORAL
  Filled 2013-10-23 (×3): qty 2

## 2013-10-23 MED ORDER — CALCIUM CARBONATE ANTACID 500 MG PO CHEW: 2.0000 | CHEWABLE_TABLET | ORAL | Status: DC | PRN

## 2013-10-23 MED ORDER — DOCUSATE SODIUM 100 MG PO CAPS
100.0000 mg | ORAL_CAPSULE | Freq: Every day | ORAL | Status: DC
  Administered 2013-10-24: 100 mg via ORAL
  Filled 2013-10-23: qty 1

## 2013-10-23 MED ORDER — INSULIN NPH (HUMAN) (ISOPHANE) 100 UNIT/ML ~~LOC~~ SUSP
8.0000 [IU] | Freq: Two times a day (BID) | SUBCUTANEOUS | Status: DC
  Administered 2013-10-23 – 2013-10-24 (×2): 8 [IU] via SUBCUTANEOUS
  Filled 2013-10-23: qty 10

## 2013-10-23 MED ORDER — ZOLPIDEM TARTRATE 5 MG PO TABS: 5.0000 mg | ORAL_TABLET | Freq: Every evening | ORAL | Status: DC | PRN

## 2013-10-23 MED ORDER — INSULIN ASPART 100 UNIT/ML ~~LOC~~ SOLN
10.0000 [IU] | Freq: Three times a day (TID) | SUBCUTANEOUS | Status: DC
  Administered 2013-10-23 – 2013-10-24 (×3): 10 [IU] via SUBCUTANEOUS

## 2013-10-23 NOTE — H&P (Signed)
Rebecca Mann is a 23 y.o. female G47P0101 with IUP at [redacted]w[redacted]d presenting for uncontrolled glucose. Pt states she has been having none contractions, associated with none vaginal bleeding.  Membranes are intact, with active fetal movement.   [facility]    Prenatal History/Complications: Pt. Is type I Diabetic Was admitted to the hospital today for glucose control after having elevated blood glucose 200's - 300's at her last two clinic visits. She currently has blurry vision, mild headache, nausea, fruity taste in her mouth. She complains of mild lower abdominal pain. She denies emesis, fever, chills, or epigastric pain at this time. She says that she has experienced DKA previously, however it has been 4 years since she has been hospitalized. She has previously had good glucose control on humalog 15u with meals, and lantus 40u qhs. She says only in the past couple of weeks has she lost control of her blood sugar, with home glucose readings between 100 - 300.   Past Medical History: Past Medical History  Diagnosis Date  . Pregnancy induced hypertension   . Diabetes mellitus without complication   . Rheumatoid arthritis     Past Surgical History: Past Surgical History  Procedure Laterality Date  . Cesarean section      Obstetrical History: OB History   Grav Para Term Preterm Abortions TAB SAB Ect Mult Living   2 1  1      1       Gynecological History: OB History   Grav Para Term Preterm Abortions TAB SAB Ect Mult Living   2 1  1      1       Social History: History   Social History  . Marital Status: Single    Spouse Name: N/A    Number of Children: N/A  . Years of Education: N/A   Social History Main Topics  . Smoking status: Former Smoker    Quit date: 09/19/2011  . Smokeless tobacco: Never Used  . Alcohol Use: No  . Drug Use: No  . Sexual Activity: Yes    Birth Control/ Protection: None   Other Topics Concern  . Not on file   Social History Narrative  . No  narrative on file    Family History: Family History  Problem Relation Age of Onset  . Hypertension Mother   . Diabetes Mother   . Diabetes Brother   . Diabetes Maternal Grandmother   . Diabetes Maternal Grandfather     Allergies: Allergies  Allergen Reactions  . Fish Allergy Anaphylaxis and Rash    Prescriptions prior to admission  Medication Sig Dispense Refill  . aspirin 81 MG tablet Take 1 tablet (81 mg total) by mouth daily.  30 tablet  4  . fluconazole (DIFLUCAN) 150 MG tablet Take 1 tablet (150 mg total) by mouth once.  2 tablet  0  . insulin glargine (LANTUS) 100 UNIT/ML injection Inject 40 Units into the skin at bedtime.      . insulin lispro (HUMALOG) 100 UNIT/ML injection Inject 15 Units into the skin 3 (three) times daily before meals.      . Prenatal Vit-Fe Fumarate-FA (PRENATAL MULTIVITAMIN) TABS tablet Take 1 tablet by mouth daily at 12 noon.      terconazole (TERAZOL 7) 0.4 % vaginal cream Place 1 applicator vaginally at bedtime.  45 g  0  . ACCU-CHEK FASTCLIX LANCETS MISC Inject 1 each into the skin 4 (four) times daily. 11/19/2011 for testing 4 times daily  102  each  12  . glucose blood test strip Use as instructed to test blood sugar 4 times daily.  50 each  12     Review of Systems   Constitutional: See HPI above.   Blood pressure 113/66, pulse 97, temperature 98.7 F (37.1 C), temperature source Oral, resp. rate 18, height 5\' 2"  (1.575 m), weight 66.225 kg (146 lb), last menstrual period 04/10/2013. General appearance: alert, cooperative and no distress Lungs: clear to auscultation bilaterally Heart: regular rate and rhythm Abdomen: soft, non-tender; bowel sounds normal Pelvic: 0cm, 0% , High Extremities: Homans sign is negative, no sign of DVT Neurologically, grossly intact Presentation: Unsure Fetal monitoringNo fetal monitoring at this time.  Uterine activityNone     Prenatal labs: ABO, Rh: A/Positive/-- (05/11 0000) Antibody: Negative (05/11  0000) Rubella:   RPR: NON REAC (06/29 1148)  HBsAg:    HIV: NONREACTIVE (06/29 1148)  GBS:    1 hr Glucola  Type I diabetic Genetic screening  - at HD no records at this time.  Anatomy 07-21-1983 - Normal anatomy   Prenatal Transfer Tool  Maternal Diabetes: Yes:  Diabetes Type:  Insulin/Medication controlled Genetic Screening: Declined - need to obtain from HD Maternal Ultrasounds/Referrals: Normal Fetal Ultrasounds or other Referrals:  None Maternal Substance Abuse:  No Significant Maternal Medications:  Meds include: Other:  Insulin Significant Maternal Lab Results: Lab values include: Other:  Glucose - 300's     Results for orders placed in visit on 10/23/13 (from the past 24 hour(s))  POCT URINALYSIS DIP (DEVICE)   Collection Time    10/23/13 11:47 AM      Result Value Ref Range   Glucose, UA 500 (*) NEGATIVE mg/dL   Bilirubin Urine NEGATIVE  NEGATIVE   Ketones, ur NEGATIVE  NEGATIVE mg/dL   Specific Gravity, Urine 1.025  1.005 - 1.030   Hgb urine dipstick TRACE (*) NEGATIVE   pH 6.0  5.0 - 8.0   Protein, ur NEGATIVE  NEGATIVE mg/dL   Urobilinogen, UA 0.2  0.0 - 1.0 mg/dL   Nitrite NEGATIVE  NEGATIVE   Leukocytes, UA TRACE (*) NEGATIVE    Assessment: Rebecca Mann is a 23 y.o. G2P0101 at [redacted]w[redacted]d by L= 20+4 here for Hyperglycemia.  1. Hyperglycemia - will give light meal -  Will start on NPH 8units qam, qhs, and 10units Novolog with meals  - Consult Nutrition  2. IUP at 28w - + FM, No bleeding, or LOF  Mann, Rebecca G 10/23/2013, 3:09 PM D/W Dr. 12/24/2013 DM consult Keep your scheduled appointment with your prenatal care provider.

## 2013-10-23 NOTE — Patient Instructions (Signed)
Return to clinic for any obstetric concerns or go to MAU for evaluation  

## 2013-10-23 NOTE — Progress Notes (Signed)
On blood sugar review, she has fastings in the 200s, 307; postprandials mostly over 200, as high as 265. Reporting nausea and stomach pain. Not taking prescribed insulin well, does not know what dosages she needs.  Recommended admission to optimize glycemic control; emphasized increased risk of maternal and fetal morbidity and mortality with poor glycemic control.  Patient will go home first and return for admission. Dr. Shawnie Pons (Attedning on call), Antenatal Unit and Admissions informed.

## 2013-10-23 NOTE — Progress Notes (Signed)
Pt reports no problems today.

## 2013-10-24 ENCOUNTER — Ambulatory Visit (HOSPITAL_COMMUNITY): Payer: Medicaid Other

## 2013-10-24 DIAGNOSIS — O24919 Unspecified diabetes mellitus in pregnancy, unspecified trimester: Secondary | ICD-10-CM | POA: Diagnosis not present

## 2013-10-24 LAB — GLUCOSE, CAPILLARY
GLUCOSE-CAPILLARY: 154 mg/dL — AB (ref 70–99)
GLUCOSE-CAPILLARY: 60 mg/dL — AB (ref 70–99)
Glucose-Capillary: 152 mg/dL — ABNORMAL HIGH (ref 70–99)
Glucose-Capillary: 149 mg/dL — ABNORMAL HIGH (ref 70–99)
Glucose-Capillary: 131 mg/dL — ABNORMAL HIGH (ref 70–99)
Glucose-Capillary: 138 mg/dL — ABNORMAL HIGH (ref 70–99)

## 2013-10-24 MED ORDER — INSULIN ASPART 100 UNIT/ML ~~LOC~~ SOLN
12.0000 [IU] | Freq: Three times a day (TID) | SUBCUTANEOUS | Status: DC
  Administered 2013-10-24 – 2013-10-25 (×2): 12 [IU] via SUBCUTANEOUS

## 2013-10-24 MED ORDER — INSULIN ASPART 100 UNIT/ML ~~LOC~~ SOLN
0.0000 [IU] | Freq: Four times a day (QID) | SUBCUTANEOUS | Status: DC
  Administered 2013-10-24 (×2): 3 [IU] via SUBCUTANEOUS

## 2013-10-24 MED ORDER — INSULIN NPH (HUMAN) (ISOPHANE) 100 UNIT/ML ~~LOC~~ SUSP
10.0000 [IU] | Freq: Two times a day (BID) | SUBCUTANEOUS | Status: DC
  Administered 2013-10-24 – 2013-10-25 (×2): 10 [IU] via SUBCUTANEOUS
  Filled 2013-10-24: qty 10

## 2013-10-24 NOTE — Progress Notes (Signed)
Antenatal Nutrition Assessment:  Currently  28 1/[redacted] weeks gestation, with hyperglycemia. Hx DM I since age 23 years. Height  62 "  Weight 146 lbs  pre-pregnancy weight 130 lbs .  Pre-pregnancy  BMI 23.8  IBW 110 lbs Total weight gain 16.lbs Weight gain goals 25-35 lbs Estimated needs: 17-1900 kcal/day, 57-67 grams protein/day, 2.1 liters fluid/day  CHO mod gestational diet  Current diet prescription will provide for increased needs.  Nutrition related labs  CBG (last 3)   Recent Labs  10/23/13 1620 10/23/13 1833 10/23/13 2159  GLUCAP 115* 146* 117*   Improved CBG's since adm   Nutrition Dx: Increased nutrient needs r/t pregnancy and fetal growth requirements aeb [redacted] weeks gestation.  Noted consult for diet education/assessment of knowledge. Pt received GDM diet education on 6/1 in [facility] at 23 weeks  Plan: Will follow-up this afternoon after scheduled clinic apts to assess pts diet knowledge  Elisabeth Cara M.Odis Luster LDN Neonatal Nutrition Support Specialist/RD III Pager [phone]

## 2013-10-24 NOTE — H&P (Signed)
Attestation of Attending Supervision of Advanced Practitioner (PA/CNM/NP): Evaluation and management procedures were performed by the Advanced Practitioner under my supervision and collaboration.  I have reviewed the Advanced Practitioner's note and chart, and I agree with the management and plan.  Reva Bores, MD [facility] Attending 10/24/2013 8:29 AM

## 2013-10-24 NOTE — Progress Notes (Signed)
Spoke with patient about diabetes and home regimen for diabetes control. Patient reports that she was diagnosed with diabetes at the age of 23 years old and she was told that she has Type 2 diabetes.  According to the patient she was initially on oral medications for diabetes and has was put on insulin 3 years ago.  Prior to coming to the hospital she was taking Lantus 40 units QHS and Humalog 15 units TID with meals as an outpatient for diabetes control.  Patient states that she moved from [place] about 2-3 months ago and she does not have a local endocrinologist established yet but she states that she was given a list of endocrinologist in the clinic and plans to find a local endocrinologist. Patient states that she has a 55 year old son and he was born at [redacted] weeks gestation and had some issues with low blood glucose and jaundice after he was delivered.  Discussed importance of checking CBGs and maintaining good CBG control to prevent acute and chronic complications.  Discussed implications for fetus if blood glucose is not controlled during pregnancy.  Patient states that she checks her blood glucose 3-4 times per day and it ranges from 80-300's mg/dl usually and she reports waking up with low blood glucose about 2 times per week. Discussed impact of nutrition, exercise, stress, sickness, and medications on diabetes control.  Discussed carbohydrates and patient reports that she feels she knows what food choices are best for diabetes control.  Reviewed fasting and post prandial glucose goals with patient.  Provided booklet on gestational diabetes which provides more information on importance of tight glycemic control for her and her baby. Asked patient to review booklet and let staff know if she has any questions. During conversation patient inquired about an insulin pump. Briefly discussed insulin pumps and encouraged patient to talk with the doctor in the clinic about the possibility of starting on an insulin  pump.  Encouraged patient to find a local endocrinologist so she can get help with improving diabetes control.  Patient verbalized understanding of information discussed and she states that she has no further questions at this time related to diabetes.   Discussed patient and recommendations with Dr. Penne Lash and orders given then entered.    Thanks, Orlando Penner, RN, MSN, CCRN Diabetes Coordinator Inpatient Diabetes Program [phone] (Team Pager) [phone] (AP office) [phone] [facility] office)

## 2013-10-24 NOTE — Progress Notes (Signed)
UR completed 

## 2013-10-24 NOTE — Progress Notes (Signed)
  Nutrition Dx: Food and nutrition-related knowledge deficit r/t limited comprehension of previous education aeb pt report.  Nutrition education consult for Carbohydrate Modified Gestational Diabetic Diet completed.  "Meal  plan for gestational diabetics" handout given to patient.  Basic concepts reviewed.  Questions answered.  Patient verbalizes understanding.  Elisabeth Cara M.Odis Luster LDN Neonatal Nutrition Support Specialist/RD III Pager [phone]

## 2013-10-24 NOTE — Progress Notes (Signed)
Patient ID: Rebecca Mann, female   DOB: 1990-07-14, 23 y.o.   MRN: [id number] [facility] ANTEPARTUM COMPREHENSIVE PROGRESS NOTE  Rebecca Mann is a 23 y.o. G2P0101 at [redacted]w[redacted]d  who is admitted for uncontrolled diabetes in pregnancy.    Length of Stay:  1  Days  Subjective: Pt with no complaints.  She reports + FM, no ctx or LOF Patient reports good fetal movement.    Vitals:  Blood pressure 102/57, pulse 82, temperature 98.8 F (37.1 C), temperature source Oral, resp. rate 18, height 5\' 2"  (1.575 m), weight 146 lb (66.225 kg), last menstrual period 04/10/2013. Physical Examination: General appearance - alert, well appearing, and in no distress Abdomen - soft, nontender, nondistended, no masses or organomegaly; gravid Extremities - no pedal edema noted Cervical Exam: Not evaluated.  Membranes:intact  Fetal Monitoring:  Baseline: 150's bpm, Variability: Good {> 6 bpm), Accelerations: Reactive and Decelerations: Absent  Labs:  Results for orders placed during the hospital encounter of 10/23/13 (from the past 24 hour(s))  GLUCOSE, CAPILLARY   Collection Time    10/23/13  3:33 PM      Result Value Ref Range   Glucose-Capillary 55 (*) 70 - 99 mg/dL   Comment 1 Documented in Chart     Comment 2 Notify RN    TYPE AND SCREEN   Collection Time    10/23/13  3:33 PM      Result Value Ref Range   ABO/RH(D) A POS     Antibody Screen POS     Sample Expiration 10/26/2013     Antibody Identification ANTI LEA (Lewis a)     PT AG Type NEGATIVE FOR LEWIS A ANTIGEN     DAT, IgG NEG     Unit Number 12/27/2013     Blood Component Type RED CELLS,LR     Unit division 00     Status of Unit ALLOCATED     Transfusion Status OK TO TRANSFUSE     Crossmatch Result COMPATIBLE     Unit Number [id number]     Blood Component Type RED CELLS,LR     Unit division 00     Status of Unit ALLOCATED     Transfusion Status OK TO TRANSFUSE     Crossmatch Result COMPATIBLE    COMPREHENSIVE METABOLIC  PANEL   Collection Time    10/23/13  3:35 PM      Result Value Ref Range   Sodium 137  137 - 147 mEq/L   Potassium 3.4 (*) 3.7 - 5.3 mEq/L   Chloride 101  96 - 112 mEq/L   CO2 24  19 - 32 mEq/L   Glucose, Bld 58 (*) 70 - 99 mg/dL   BUN 7  6 - 23 mg/dL   Creatinine, Ser 12/24/13 (*) 0.50 - 1.10 mg/dL   Calcium 8.8  8.4 - 9.32 mg/dL   Total Protein 6.4  6.0 - 8.3 g/dL   Albumin 2.8 (*) 3.5 - 5.2 g/dL   AST 12  0 - 37 U/L   ALT 12  0 - 35 U/L   Alkaline Phosphatase 59  39 - 117 U/L   Total Bilirubin <0.2 (*) 0.3 - 1.2 mg/dL   GFR calc non Af Amer >90  >90 mL/min   GFR calc Af Amer >90  >90 mL/min   Anion gap 12  5 - 15  CBC WITH DIFFERENTIAL   Collection Time    10/23/13  3:35 PM      Result Value Ref  Range   WBC 7.9  4.0 - 10.5 K/uL   RBC 4.87  3.87 - 5.11 MIL/uL   Hemoglobin 10.9 (*) 12.0 - 15.0 g/dL   HCT 95.6 (*) 38.7 - 56.4 %   MCV 69.2 (*) 78.0 - 100.0 fL   MCH 22.4 (*) 26.0 - 34.0 pg   MCHC 32.3  30.0 - 36.0 g/dL   RDW 33.2  95.1 - 88.4 %   Platelets 195  150 - 400 K/uL   Neutrophils Relative % 60  43 - 77 %   Neutro Abs 4.8  1.7 - 7.7 K/uL   Lymphocytes Relative 30  12 - 46 %   Lymphs Abs 2.4  0.7 - 4.0 K/uL   Monocytes Relative 7  3 - 12 %   Monocytes Absolute 0.5  0.1 - 1.0 K/uL   Eosinophils Relative 3  0 - 5 %   Eosinophils Absolute 0.2  0.0 - 0.7 K/uL   Basophils Relative 0  0 - 1 %   Basophils Absolute 0.0  0.0 - 0.1 K/uL  GLUCOSE, CAPILLARY   Collection Time    10/23/13  4:20 PM      Result Value Ref Range   Glucose-Capillary 115 (*) 70 - 99 mg/dL   Comment 1 Documented in Chart     Comment 2 Notify RN    GLUCOSE, CAPILLARY   Collection Time    10/23/13  6:33 PM      Result Value Ref Range   Glucose-Capillary 146 (*) 70 - 99 mg/dL   Comment 1 Documented in Chart     Comment 2 Notify RN    GLUCOSE, CAPILLARY   Collection Time    10/23/13  9:59 PM      Result Value Ref Range   Glucose-Capillary 117 (*) 70 - 99 mg/dL  POCT URINALYSIS DIP (DEVICE)    Collection Time    10/23/13 11:47 AM      Result Value Ref Range   Glucose, UA 500 (*) NEGATIVE mg/dL   Bilirubin Urine NEGATIVE  NEGATIVE   Ketones, ur NEGATIVE  NEGATIVE mg/dL   Specific Gravity, Urine 1.025  1.005 - 1.030   Hgb urine dipstick TRACE (*) NEGATIVE   pH 6.0  5.0 - 8.0   Protein, ur NEGATIVE  NEGATIVE mg/dL   Urobilinogen, UA 0.2  0.0 - 1.0 mg/dL   Nitrite NEGATIVE  NEGATIVE   Leukocytes, UA TRACE (*) NEGATIVE    Imaging Studies:    n/a   Medications:  Scheduled . docusate sodium  100 mg Oral Daily  . insulin aspart  10 Units Subcutaneous TID WC  . insulin NPH Human  8 Units Subcutaneous BID AC & HS   I have reviewed the patient's current medications.  ASSESSMENT: Patient Active Problem List   Diagnosis Date Noted  . Hyperglycemia in pregnancy 10/23/2013  . Vaginal ulcer 10/02/2013  . Previous cesarean delivery, delivered, with or without mention of antepartum condition 09/18/2013  . Type 1 diabetes mellitus complicating pregnancy, antepartum 09/18/2013  . Type 1 diabetes mellitus 09/18/2013  . Rheumatoid arthritis 09/18/2013  . Supervision of high-risk pregnancy 09/18/2013  . Hx of preeclampsia, prior pregnancy, currently pregnant 09/18/2013  . Need for maternal serum alpha-protein (MSAFP) screening 09/14/2013    PLAN: Diabetic education consult and nutrition consult ordered for today Continue routine antenatal care.   HARRAWAY-SMITH, Charlaine Utsey 10/24/2013,9:20 AM

## 2013-10-25 DIAGNOSIS — O24919 Unspecified diabetes mellitus in pregnancy, unspecified trimester: Secondary | ICD-10-CM | POA: Diagnosis not present

## 2013-10-25 LAB — GLUCOSE, CAPILLARY
Glucose-Capillary: 83 mg/dL (ref 70–99)
Glucose-Capillary: 57 mg/dL — ABNORMAL LOW (ref 70–99)

## 2013-10-25 MED ORDER — COMPLETENATE 29-1 MG PO CHEW: 1.0000 | CHEWABLE_TABLET | Freq: Every day | ORAL | Status: DC

## 2013-10-25 MED ORDER — INSULIN ASPART 100 UNIT/ML ~~LOC~~ SOLN: 12.0000 [IU] | Freq: Three times a day (TID) | SUBCUTANEOUS | Status: DC

## 2013-10-25 MED ORDER — INSULIN NPH (HUMAN) (ISOPHANE) 100 UNIT/ML ~~LOC~~ SUSP: 10.0000 [IU] | Freq: Two times a day (BID) | SUBCUTANEOUS | Status: DC

## 2013-10-25 NOTE — Progress Notes (Signed)
RN called to room and pt c/o shaking and feeling like her BS is low.  CBG 57 and pt given a juice and has ordered her breakfast

## 2013-10-25 NOTE — Discharge Instructions (Signed)
Type 1 or Type 2 Diabetes Mellitus During Pregnancy Diabetes mellitus, often simply referred to as diabetes, is a long-term (chronic) disease. Type 1 diabetes occurs when the islet cells, which are in the pancreas and make the hormone insulin, are destroyed and can no longer make insulin. Type 2 diabetes occurs when the pancreas does not make enough insulin, the cells are less responsive to the insulin that is made (insulin resistance), or both. Insulin is needed to move sugars from food into the tissue cells. The tissue cells use the sugars for energy. The lack of insulin or the lack of normal response to insulin causes excess sugars to build up in the blood instead of going into the tissue cells. As a result, high blood sugar (hyperglycemia) develops.  If blood glucose levels are kept in the normal range both before and during pregnancy, women can have a healthy pregnancy. If your blood glucose levels are not well controlled, there may be risks to you, your unborn baby, and your labor and delivery. Also, there may be risks to your baby once he or she is born.  RISK FACTORS  You are predisposed to developing type 1 diabetes if someone in your family has diabetes and you are exposed to certain environmental triggers.  You have an increased chance of developing type 2 diabetes if you have a family history of diabetes and also have one or more of the following risk factors:  Being overweight.  Having an inactive lifestyle.  Having a history of consistently eating high-calorie foods. SYMPTOMS  Increased thirst (polydipsia).  Increased urination (polyuria).  Increased urination during the night (nocturia).  Weight loss. This weight loss may be rapid.  Frequent, recurring infections.  Tiredness (fatigue).  Weakness.  Vision changes, such as blurred vision.  Fruity smell to your breath.  Abdominal pain.  Nausea or vomiting. DIAGNOSIS  Diabetes is diagnosed when blood glucose levels  are increased. Your blood glucose level may be checked by one or more of the following blood tests:  A fasting blood glucose test. You will not be allowed to eat for at least 8 hours before a blood sample is taken.  A random blood glucose test. Your blood glucose is checked at any time of the day regardless of when you ate.  A hemoglobin A1c blood glucose test. A hemoglobin A1c test provides information about blood glucose control over the previous 3 months.  An oral glucose tolerance test (OGTT). Your blood glucose is measured after you have not eaten (fasted) for 1-3 hours and then after you drink a glucose-containing beverage. An OGTT is usually performed during weeks 24-28 of your pregnancy. TREATMENT   You will need to take diabetes medicine or insulin daily to keep blood glucose levels in the desired range.  You will need to match insulin dosing with exercise and healthy food choices. The treatment goal is to maintain the before-meal (preprandial), bedtime, and overnight blood glucose level at 60-99 mg/dL during pregnancy. The treatment goal is to further maintain the peak after-meal blood sugar (postprandial glucose) level at 100-140 mg/dL.  HOME CARE INSTRUCTIONS   Have your hemoglobin A1c level checked twice a year.  Perform daily blood glucose monitoring as directed by your health care provider. It is common to perform frequent blood glucose monitoring.  Monitor urine ketones when you are sick and as directed by your health care provider.  Take your diabetes medicine and insulin as directed by your health care provider to maintain your blood glucose   level in the desired range.  Never run out of diabetes medicine or insulin. It is needed every day.  Adjust insulin based on your intake of carbohydrates. Carbohydrates can raise blood glucose levels but need to be included in your diet. Carbohydrates provide vitamins, minerals, and fiber, which are an essential part of a healthy  diet. Carbohydrates are found in fruits, vegetables, whole grains, dairy products, legumes, and foods containing added sugars.  Eat healthy foods. Alternate 3 meals with 3 snacks.  Maintain a healthy weight gain. The usual total expected weight gain varies according to your prepregnancy body mass index (BMI).  Carry a medical alert card or wear medical alert jewelry.  Carry a 15-gram carbohydrate snack with you at all times to treat low blood sugar (hypoglycemia). Some examples of 15-gram carbohydrate snacks include:  Glucose tablets, 3 or 4.  Glucose gel, 15 gram tube.  Raisins, 2 Tbsp (24 grams).  Jelly beans, 6.  Animal crackers, 8.  Fruit juice, regular soda, or low-fat milk, 4 ounces (120 mL).  Gummy treats, 9.  Recognize hypoglycemia. Hypoglycemia during pregnancy occurs with blood glucose levels of 60 mg/dL and below. The risk for hypoglycemia increases when fasting or skipping meals, during or after intense exercise, and during sleep. Hypoglycemia symptoms can include:  Tremors or shakes.  Decreased ability to concentrate.  Sweating.  Increased heart rate.  Headache.  Dry mouth.  Hunger.  Irritability.  Anxiety.  Restless sleep.  Altered speech or coordination.  Confusion.  Treat hypoglycemia promptly. If you are alert and able to safely swallow, follow the 15:15 rule:  Take 15-20 grams of rapid-acting glucose or carbohydrate. Rapid-acting options include glucose gel, glucose tablets, or 4 ounces (120 mL) of fruit juice, regular soda, or low-fat milk.  Check your blood glucose level 15 minutes after taking the glucose.  Take an additional 15-20 grams of glucose if the repeat blood glucose level is still 70 mg/dL or below.  Eat a meal or snack within 1 hour once blood glucose levels return to normal.  Engage in at least 30 minutes of physical activity a day or as directed by your health care provider. Ten minutes of physical activity timed 30  minutes after each meal is encouraged to control postprandial blood glucose levels.  Watch for polyuria (excess urination) and polydipsia (feeling extra thirsty), which are early signs of hyperglycemia. An early awareness of hyperglycemia allows for prompt treatment. Treat hyperglycemia as directed by your health care provider.  Adjust your insulin dosing and food intake, as needed, if you start a new exercise or sport.  Follow your sick day plan any time you are unable to eat or drink as usual.  Avoid tobacco and alcohol use.  Follow up with your health care provider regularly.  Follow the advice of your health care provider regarding your prenatal and post-delivery (postpartum) appointments, meal planning, exercise, medicines, vitamins, blood tests, other medical tests, and physical activities.  Continue daily skin and foot care. Examine your skin and feet daily for cuts, bruises, redness, nail problems, bleeding, blisters, or sores. A foot exam by a health care provider should be done annually.  Brush your teeth and gums at least twice a day and floss at least once a day. Follow up with your dentist regularly.  Schedule an eye exam during the first trimester of your pregnancy or as directed by your health care provider.  Share your diabetes management plan with your workplace or school.  Stay up-to-date with immunizations.    Learn to manage stress.  Obtain ongoing diabetes education and support as needed.  Your health care provider may recommend that you take one low-dose aspirin (81 mg) each day to help prevent high blood pressure during your pregnancy (preeclampsia or eclampsia). You may be at risk for preeclampsia or eclampsia if:  You had preeclampsia or eclampsia during a previous pregnancy.  Your baby did not grow as expected during a previous pregnancy.  You experienced preterm birth with a previous pregnancy.  You experienced a separation of the placenta from the uterus  (placental abruption) during a previous pregnancy.  You experienced the loss of your baby during a previous pregnancy.  You are pregnant with more than one baby.  You have other medical conditions, such as high blood pressure or autoimmune disease. SEEK MEDICAL CARE IF:   You are unable to eat food or drink fluids for more than 6 hours.  You have nausea and vomiting for more than 6 hours.  You have a blood glucose level of 200 mg/dL and you have ketones in your urine.  There is a change in mental status.  You develop vision problems.  You have a persistent headache.  You have upper abdominal pain or discomfort.  You have an additional serious sickness.  You have diarrhea for more than 6 hours.  You have been sick or have had a fever for 2 days and are not getting better. SEEK IMMEDIATE MEDICAL CARE IF:  You have difficulty breathing.  You no longer feel your baby moving.  You are bleeding or have discharge from your vagina.  You start having premature contractions or labor. MAKE SURE YOU:  Understand these instructions.  Will watch your condition.  Will get help right away if you are not doing well or get worse. Document Released: 12/30/2011 Document Revised: 04/11/2013 Document Reviewed: 12/30/2011 Exit[employer] Patient Information 2015 [employer]. This information is not intended to replace advice given to you by your health care provider. Make sure you discuss any questions you have with your health care provider.  

## 2013-10-25 NOTE — Discharge Summary (Signed)
Physician Discharge Summary  Patient ID: Rebecca Mann MRN: [id number] DOB/AGE: 1991/03/26 22 y.o.  Admit date: 10/23/2013 Discharge date: 10/25/2013  Admission Diagnoses: IUP @ 28.0wks; DM- type 1; Hyperglycemia  Discharge Diagnoses:  Active Problems:   Hyperglycemia in pregnancy  IUP @ 28.2wks; DM- type 1; mod glucose control  Discharged Condition: good  Hospital Course:    Rebecca Mann is a 23yo [phone] @ 28.0wks type I diabetic admitted to the hospital for glucose control after having elevated blood glucose 200's - 300's at her last two clinic visits. Upon arrival she had blurry vision, mild headache, nausea, fruity taste in her mouth. She complained of mild lower abdominal pain. She denied emesis, fever, chills, or epigastric pain. She says that she has experienced DKA previously, however it has been 4 years since she has been hospitalized. She has previously had good glucose control on humalog 15u with meals, and lantus 40u qhs. She says only in the past couple of weeks has she lost control of her blood sugar, with home glucose readings between 100 - 300. Upon admission insulin regimen was modified (see tx below) and CBGs normalized to FBS 83 and 2hr PPs in the 130s. Pt was encouraged by the diabetes coordinator to find a local endocrinologist as she has recently relocated to [facility] from [place] (list provided).   Consults: nutrition/DM management  Significant Diagnostic Studies: labs:  CBC    Component Value Date/Time   WBC 7.9 10/23/2013 1535   RBC 4.87 10/23/2013 1535   HGB 10.9* 10/23/2013 1535   HGB 12.3 08/28/2013   HCT 33.7* 10/23/2013 1535   HCT 39 08/28/2013   PLT 195 10/23/2013 1535   PLT 234 08/28/2013   MCV 69.2* 10/23/2013 1535   MCH 22.4* 10/23/2013 1535   MCHC 32.3 10/23/2013 1535   RDW 13.7 10/23/2013 1535   LYMPHSABS 2.4 10/23/2013 1535   MONOABS 0.5 10/23/2013 1535   EOSABS 0.2 10/23/2013 1535   BASOSABS 0.0 10/23/2013 1535   CMP     Component Value Date/Time   NA 137 10/23/2013  1535   K 3.4* 10/23/2013 1535   CL 101 10/23/2013 1535   CO2 24 10/23/2013 1535   GLUCOSE 58* 10/23/2013 1535   BUN 7 10/23/2013 1535   CREATININE 0.49* 10/23/2013 1535   CREATININE 0.43* 09/18/2013 0944   CALCIUM 8.8 10/23/2013 1535   PROT 6.4 10/23/2013 1535   ALBUMIN 2.8* 10/23/2013 1535   AST 12 10/23/2013 1535   ALT 12 10/23/2013 1535   ALKPHOS 59 10/23/2013 1535   BILITOT <0.2* 10/23/2013 1535   GFRNONAA >90 10/23/2013 1535   GFRAA >90 10/23/2013 1535   Urinalysis    Component Value Date/Time   COLORURINE YELLOW 10/04/2013 0606   APPEARANCEUR CLEAR 10/04/2013 0606   LABSPEC 1.025 10/23/2013 1147   PHURINE 6.0 10/23/2013 1147   GLUCOSEU 500* 10/23/2013 1147   HGBUR TRACE* 10/23/2013 1147   BILIRUBINUR NEGATIVE 10/23/2013 1147   KETONESUR NEGATIVE 10/23/2013 1147   PROTEINUR NEGATIVE 10/23/2013 1147   UROBILINOGEN 0.2 10/23/2013 1147   NITRITE NEGATIVE 10/23/2013 1147   LEUKOCYTESUR TRACE* 10/23/2013 1147      Treatments: insulin: Novalog 12u TID and NPH 10u BID  Discharge Exam: Blood pressure 112/56, pulse 83, temperature 98.6 F (37 C), temperature source Oral, resp. rate 18, height 5\' 2"  (1.575 m), weight 66.225 kg (146 lb), last menstrual period 04/10/2013. General appearance: alert, cooperative and no distress Resp: nl effort Extremities: extremities normal, atraumatic, no cyanosis or edema Skin: Skin color,  texture, turgor normal. No rashes or lesions  Abd: gravid; FHTs 130s, +accels, no decels; rare irritability per toco Pelvic: cx C/L upon admission  Disposition: 01-Home or Self Care     Medication List    STOP taking these medications       fluconazole 150 MG tablet  Commonly known as:  DIFLUCAN     insulin glargine 100 UNIT/ML injection  Commonly known as:  LANTUS     insulin lispro 100 UNIT/ML injection  Commonly known as:  HUMALOG     terconazole 0.4 % vaginal cream  Commonly known as:  TERAZOL 7      TAKE these medications       ACCU-CHEK FASTCLIX LANCETS Misc  Inject 1 each into  the skin 4 (four) times daily. 416.38 for testing 4 times daily     aspirin 81 MG tablet  Take 1 tablet (81 mg total) by mouth daily.     glucose blood test strip  Use as instructed to test blood sugar 4 times daily.     insulin aspart 100 UNIT/ML injection  Commonly known as:  novoLOG  Inject 12 Units into the skin 3 (three) times daily with meals.     insulin NPH Human 100 UNIT/ML injection  Commonly known as:  HUMULIN N  Inject 0.1 mLs (10 Units total) into the skin 2 (two) times daily at 8 am and 10 pm.     prenatal multivitamin Tabs tablet  Take 1 tablet by mouth daily at 12 noon.           Follow-up Information   Follow up with [facility]. (Keep next scheduled appointment.)    Contact information:   [address] [place] [address] [phone]     Has an appt scheduled for next week.  SignedCam Hai CNM 10/25/2013, 9:08 AM

## 2013-10-27 LAB — TYPE AND SCREEN
ABO/RH(D): A POS
Antibody Screen: POSITIVE
DAT, IgG: NEGATIVE
PT AG Type: NEGATIVE
UNIT DIVISION: 0
Unit division: 0

## 2013-10-30 ENCOUNTER — Encounter: Payer: Medicaid Other | Admitting: Obstetrics & Gynecology

## 2013-10-30 NOTE — Discharge Summary (Signed)
I personally reviewed insulin and glucose readings with midwife Clelia Croft.  Attestation of Attending Supervision of Advanced Practitioner (CNM/NP): Evaluation and management procedures were performed by the Advanced Practitioner under my supervision and collaboration. I have reviewed the Advanced Practitioner's note and chart, and I agree with the management and plan.  Jamillah Camilo H. 9:37 AM

## 2013-11-08 ENCOUNTER — Encounter: Payer: Self-pay | Admitting: *Deleted

## 2013-11-09 ENCOUNTER — Telehealth: Payer: Self-pay

## 2013-11-09 DIAGNOSIS — O24019 Pre-existing diabetes mellitus, type 1, in pregnancy, unspecified trimester: Secondary | ICD-10-CM

## 2013-11-09 MED ORDER — "INSULIN SYRINGE-NEEDLE U-100 25G X 5/8"" 1 ML MISC": 1.0000 | Freq: Every day | Status: AC

## 2013-11-09 NOTE — Telephone Encounter (Signed)
Patient called stating she was given new RX for insulin but never RX for syringes. After chart review, syringes had not been ordered. Syringes sent to patient pharmacy. Patient informed. No further questions or concerns.

## 2013-11-10 ENCOUNTER — Encounter: Payer: Self-pay | Admitting: General Practice

## 2013-11-13 ENCOUNTER — Encounter: Payer: Medicaid Other | Admitting: Obstetrics & Gynecology

## 2013-11-21 ENCOUNTER — Other Ambulatory Visit (HOSPITAL_COMMUNITY): Payer: Self-pay | Admitting: Maternal and Fetal Medicine

## 2013-11-21 ENCOUNTER — Encounter (HOSPITAL_COMMUNITY): Payer: Self-pay

## 2013-11-21 ENCOUNTER — Encounter (HOSPITAL_COMMUNITY): Payer: Self-pay | Admitting: Obstetrics and Gynecology

## 2013-11-21 ENCOUNTER — Inpatient Hospital Stay (HOSPITAL_COMMUNITY)
Admission: AD | Admit: 2013-11-21 | Discharge: 2013-11-23 | DRG: 781 | Disposition: A | Discharge status: Home / Self Care | Payer: Medicaid Other | Source: Ambulatory Visit | Attending: Obstetrics & Gynecology | Admitting: Obstetrics & Gynecology

## 2013-11-21 ENCOUNTER — Ambulatory Visit (HOSPITAL_COMMUNITY)
Admit: 2013-11-21 | Discharge: 2013-11-21 | Disposition: A | Discharge status: Home / Self Care | Payer: Medicaid Other | Attending: Family Medicine | Admitting: Family Medicine

## 2013-11-21 DIAGNOSIS — Z8249 Family history of ischemic heart disease and other diseases of the circulatory system: Secondary | ICD-10-CM | POA: Diagnosis not present

## 2013-11-21 DIAGNOSIS — O34219 Maternal care for unspecified type scar from previous cesarean delivery: Secondary | ICD-10-CM | POA: Diagnosis present

## 2013-11-21 DIAGNOSIS — E119 Type 2 diabetes mellitus without complications: Secondary | ICD-10-CM

## 2013-11-21 DIAGNOSIS — M069 Rheumatoid arthritis, unspecified: Secondary | ICD-10-CM | POA: Diagnosis present

## 2013-11-21 DIAGNOSIS — Z833 Family history of diabetes mellitus: Secondary | ICD-10-CM | POA: Diagnosis not present

## 2013-11-21 DIAGNOSIS — O24919 Unspecified diabetes mellitus in pregnancy, unspecified trimester: Principal | ICD-10-CM | POA: Diagnosis present

## 2013-11-21 DIAGNOSIS — Z794 Long term (current) use of insulin: Secondary | ICD-10-CM

## 2013-11-21 DIAGNOSIS — E109 Type 1 diabetes mellitus without complications: Secondary | ICD-10-CM | POA: Diagnosis present

## 2013-11-21 DIAGNOSIS — E1010 Type 1 diabetes mellitus with ketoacidosis without coma: Secondary | ICD-10-CM

## 2013-11-21 DIAGNOSIS — K219 Gastro-esophageal reflux disease without esophagitis: Secondary | ICD-10-CM

## 2013-11-21 DIAGNOSIS — Z87891 Personal history of nicotine dependence: Secondary | ICD-10-CM

## 2013-11-21 DIAGNOSIS — O24013 Pre-existing diabetes mellitus, type 1, in pregnancy, third trimester: Secondary | ICD-10-CM

## 2013-11-21 DIAGNOSIS — O24913 Unspecified diabetes mellitus in pregnancy, third trimester: Secondary | ICD-10-CM

## 2013-11-21 LAB — COMPREHENSIVE METABOLIC PANEL
ALT: 15 U/L (ref 0–35)
AST: 16 U/L (ref 0–37)
Albumin: 2.7 g/dL — ABNORMAL LOW (ref 3.5–5.2)
Alkaline Phosphatase: 90 U/L (ref 39–117)
Anion gap: 8 (ref 5–15)
BUN: 7 mg/dL (ref 6–23)
CO2: 22 meq/L (ref 19–32)
Calcium: 8.6 mg/dL (ref 8.4–10.5)
Chloride: 103 mEq/L (ref 96–112)
Creatinine, Ser: 0.46 mg/dL — ABNORMAL LOW (ref 0.50–1.10)
GLUCOSE: 166 mg/dL — AB (ref 70–99)
POTASSIUM: 4 meq/L (ref 3.7–5.3)
SODIUM: 133 meq/L — AB (ref 137–147)
Total Bilirubin: <0.2 mg/dL — ABNORMAL LOW (ref 0.3–1.2)
Total Protein: 6 g/dL (ref 6.0–8.3)

## 2013-11-21 LAB — GLUCOSE, CAPILLARY
GLUCOSE-CAPILLARY: 183 mg/dL — AB (ref 70–99)
GLUCOSE-CAPILLARY: 146 mg/dL — AB (ref 70–99)

## 2013-11-21 LAB — CBC
HCT: 33 % — ABNORMAL LOW (ref 36.0–46.0)
HEMOGLOBIN: 11 g/dL — AB (ref 12.0–15.0)
MCH: 22.8 pg — AB (ref 26.0–34.0)
MCHC: 33.3 g/dL (ref 30.0–36.0)
MCV: 68.3 fL — ABNORMAL LOW (ref 78.0–100.0)
PLATELETS: 184 10*3/uL (ref 150–400)
RBC: 4.83 MIL/uL (ref 3.87–5.11)
RDW: 13.7 % (ref 11.5–15.5)
WBC: 6.4 10*3/uL (ref 4.0–10.5)

## 2013-11-21 MED ORDER — INSULIN NPH (HUMAN) (ISOPHANE) 100 UNIT/ML ~~LOC~~ SUSP
14.0000 [IU] | Freq: Two times a day (BID) | SUBCUTANEOUS | Status: DC
  Administered 2013-11-21 – 2013-11-23 (×4): 14 [IU] via SUBCUTANEOUS

## 2013-11-21 MED ORDER — PRENATAL MULTIVITAMIN CH
1.0000 | ORAL_TABLET | Freq: Every day | ORAL | Status: DC
  Administered 2013-11-22: 1 via ORAL
  Filled 2013-11-21: qty 1

## 2013-11-21 MED ORDER — INSULIN ASPART 100 UNIT/ML ~~LOC~~ SOLN: 12.0000 [IU] | Freq: Three times a day (TID) | SUBCUTANEOUS | Status: DC

## 2013-11-21 MED ORDER — INSULIN ASPART 100 UNIT/ML ~~LOC~~ SOLN
16.0000 [IU] | Freq: Three times a day (TID) | SUBCUTANEOUS | Status: DC
  Administered 2013-11-21 – 2013-11-23 (×6): 16 [IU] via SUBCUTANEOUS

## 2013-11-21 MED ORDER — FLUCONAZOLE 150 MG PO TABS
150.0000 mg | ORAL_TABLET | Freq: Once | ORAL | Status: AC
  Administered 2013-11-21: 150 mg via ORAL
  Filled 2013-11-21: qty 1

## 2013-11-21 MED ORDER — ZOLPIDEM TARTRATE 5 MG PO TABS
5.0000 mg | ORAL_TABLET | Freq: Every evening | ORAL | Status: DC | PRN
  Administered 2013-11-23: 5 mg via ORAL
  Filled 2013-11-21: qty 1

## 2013-11-21 MED ORDER — CALCIUM CARBONATE ANTACID 500 MG PO CHEW
2.0000 | CHEWABLE_TABLET | ORAL | Status: DC | PRN
Start: 2013-11-21 — End: 2013-11-23

## 2013-11-21 MED ORDER — INSULIN NPH (HUMAN) (ISOPHANE) 100 UNIT/ML ~~LOC~~ SUSP
10.0000 [IU] | Freq: Two times a day (BID) | SUBCUTANEOUS | Status: DC
  Filled 2013-11-21: qty 10

## 2013-11-21 MED ORDER — ACETAMINOPHEN 325 MG PO TABS
650.0000 mg | ORAL_TABLET | ORAL | Status: DC | PRN
  Administered 2013-11-21 – 2013-11-23 (×4): 650 mg via ORAL
  Filled 2013-11-21 (×4): qty 2

## 2013-11-21 MED ORDER — PRENATAL MULTIVITAMIN CH: 1.0000 | ORAL_TABLET | Freq: Every day | ORAL | Status: DC

## 2013-11-21 MED ORDER — DOCUSATE SODIUM 100 MG PO CAPS
100.0000 mg | ORAL_CAPSULE | Freq: Every day | ORAL | Status: DC
  Administered 2013-11-23: 100 mg via ORAL
  Filled 2013-11-21 (×2): qty 1

## 2013-11-21 MED ORDER — ASPIRIN 81 MG PO CHEW
81.0000 mg | CHEWABLE_TABLET | Freq: Every day | ORAL | Status: DC
  Administered 2013-11-21 – 2013-11-23 (×3): 81 mg via ORAL
  Filled 2013-11-21 (×3): qty 1

## 2013-11-21 NOTE — Progress Notes (Signed)
[facility] ultrasound  Indication: 23 yr old G44P0101 at [redacted]w[redacted]d with poorly controlled type II diabetes for follow up ultrasound and BPP.  Findings: 1. Single intrauterine pregnancy. 2. Estimated fetal weight is in the 80th%. The abdominal circumference is in the >97th%. 3. Anterior placenta without evidence of previa. 4. Normal amniotic fluid index. 5. The limited anatomy survey is normal. 6. Normal biophysical profile of 8/8.  Recommendations: 1. Appropriate fetal growth: - accelerated abdominal circumference- likely due to poorly controlled diabetes 2. Type II diabetes: - previously counseled - on insulin - reviewed blood sugars and has several >300 and almost all values elevated - I recommend readmission for diabeteic patterning (patient has missed several follow up appointments) - recommend increasing novlog with meals to 16 units; and increase NPH to 14 units bid - discussed importance of strict glucose control and increased risk of stillbirth with poorly controlled diabetes - recommend fetal growth in 4 weeks - recommend antenatal testing - recommend fetal echocardiogram  Discussed with Dr. Debroah Loop who is in agreement and patient has sent to antepartum for admission  Eulis Foster, MD

## 2013-11-21 NOTE — H&P (Signed)
Antenatal HISTORY AND PHYSICAL  Arnell Pikus is a 23 y.o. female G36P0101 with IUP at [redacted]w[redacted]d presenting for patterning after having repeated glucose 200-300s and several missed appointments.  She reports +FMs, No LOF, no VB, or peripheral edema, and RUQ pain.   DM: diagnosed 4 years ago, reports current regimen novolog 10 units with meals, humalin 12 units qAM/HS.  Glucose ranging 200-300.  Reports taking insulin as scheduled and not missing any doses.  Diet: reports eating "healthy stuff".   Prenatal History/Complications:  Past Medical History: Past Medical History  Diagnosis Date  . Pregnancy induced hypertension   . Diabetes mellitus without complication   . Rheumatoid arthritis   . Preterm labor     Past Surgical History: Past Surgical History  Procedure Laterality Date  . Cesarean section      Obstetrical History: OB History   Grav Para Term Preterm Abortions TAB SAB Ect Mult Living   2 1  1      1      2013 32w c/s 2/2 preeclampsia 2015 current  Social History: History   Social History  . Marital Status: Single    Spouse Name: N/A    Number of Children: N/A  . Years of Education: N/A   Social History Main Topics  . Smoking status: Former Smoker    Quit date: 09/19/2011  . Smokeless tobacco: Never Used  . Alcohol Use: No  . Drug Use: No  . Sexual Activity: Yes    Birth Control/ Protection: None   Other Topics Concern  . None   Social History Narrative  . None    Family History: Family History  Problem Relation Age of Onset  . Hypertension Mother   . Diabetes Mother   . Diabetes Brother   . Diabetes Maternal Grandmother   . Diabetes Maternal Grandfather     Allergies: Allergies  Allergen Reactions  . Fish Allergy Anaphylaxis and Rash    Prescriptions prior to admission  Medication Sig Dispense Refill  . ACCU-CHEK FASTCLIX LANCETS MISC Inject 1 each into the skin 4 (four) times daily. 11/19/2011 for testing 4 times daily  102 each  12  .  aspirin 81 MG tablet Take 1 tablet (81 mg total) by mouth daily.  30 tablet  4  . glucose blood test strip Use as instructed to test blood sugar 4 times daily.  50 each  12  . insulin aspart (NOVOLOG) 100 UNIT/ML injection Inject 12 Units into the skin 3 (three) times daily with meals.  10 mL  11  . insulin NPH Human (HUMULIN N) 100 UNIT/ML injection Inject 0.1 mLs (10 Units total) into the skin 2 (two) times daily at 8 am and 10 pm.  10 mL  11  . Insulin Syringe-Needle U-100 (B-D INSULIN SYRINGE 1CC/25G) 25G X 5/8" 1 ML MISC 1 each by Does not apply route 5 (five) times daily.  100 each  3  . Prenatal Vit-Fe Fumarate-FA (PRENATAL MULTIVITAMIN) TABS tablet Take 1 tablet by mouth daily at 12 noon.         Review of Systems   All systems reviewed and negative except as stated in HPI  Blood pressure 106/68, pulse 85, temperature 98.2 F (36.8 C), temperature source Oral, resp. rate 18, height 5\' 2"  (1.575 m), weight 150 lb (68.04 kg), last menstrual period 04/10/2013. General appearance: alert and cooperative Lungs: normal respiratory effort, no respiratory distress Heart: regular rate Abdomen: soft, non-tender, gravid Extremities: Homans sign is negative, no sign of  DVT Reactive NST from clinic today     Prenatal labs: ABO, Rh: --/--/A POS (08/04 1650) Antibody: NEG (08/04 1650) Rubella:   RPR: NON REAC (06/29 1148)  HBsAg:    HIV: NONREACTIVE (06/29 1148)  GBS:    1 hr Glucola 166 Genetic screening  negative Anatomy US normal with increased abdominal circumference   Prenatal Transfer Tool  Maternal Diabetes: Yes:  Diabetes Type:  Insulin/Medication controlled - uncontrolled Genetic Screening: negative Maternal Ultrasounds/Referrals: Normal Fetal Ultrasounds or other Referrals:  Other: fetal echo pending Maternal Substance Abuse:  No Significant Maternal Medications:  Meds include: Other: insulin Significant Maternal Lab Results: Lab values include: Other: glucose  200-300s     Results for orders placed during the hospital encounter of 11/21/13 (from the past 24 hour(s))  GLUCOSE, CAPILLARY   Collection Time    11/21/13  4:25 PM      Result Value Ref Range   Glucose-Capillary 183 (*) 70 - 99 mg/dL   Comment 1 Documented in Chart     Comment 2 Notify RN    COMPREHENSIVE METABOLIC PANEL   Collection Time    11/21/13  4:50 PM      Result Value Ref Range   Sodium 133 (*) 137 - 147 mEq/L   Potassium 4.0  3.7 - 5.3 mEq/L   Chloride 103  96 - 112 mEq/L   CO2 22  19 - 32 mEq/L   Glucose, Bld 166 (*) 70 - 99 mg/dL   BUN 7  6 - 23 mg/dL   Creatinine, Ser 8.88 (*) 0.50 - 1.10 mg/dL   Calcium 8.6  8.4 - 28.0 mg/dL   Total Protein 6.0  6.0 - 8.3 g/dL   Albumin 2.7 (*) 3.5 - 5.2 g/dL   AST 16  0 - 37 U/L   ALT 15  0 - 35 U/L   Alkaline Phosphatase 90  39 - 117 U/L   Total Bilirubin <0.2 (*) 0.3 - 1.2 mg/dL   GFR calc non Af Amer >90  >90 mL/min   GFR calc Af Amer >90  >90 mL/min   Anion gap 8  5 - 15  CBC   Collection Time    11/21/13  4:50 PM      Result Value Ref Range   WBC 6.4  4.0 - 10.5 K/uL   RBC 4.83  3.87 - 5.11 MIL/uL   Hemoglobin 11.0 (*) 12.0 - 15.0 g/dL   HCT 03.4 (*) 91.7 - 91.5 %   MCV 68.3 (*) 78.0 - 100.0 fL   MCH 22.8 (*) 26.0 - 34.0 pg   MCHC 33.3  30.0 - 36.0 g/dL   RDW 05.6  97.9 - 48.0 %   Platelets 184  150 - 400 K/uL  TYPE AND SCREEN   Collection Time    11/21/13  4:50 PM      Result Value Ref Range   ABO/RH(D) A POS     Antibody Screen NEG     Sample Expiration 11/24/2013    GLUCOSE, CAPILLARY   Collection Time    11/21/13  7:10 PM      Result Value Ref Range   Glucose-Capillary 146 (*) 70 - 99 mg/dL   Comment 1 Documented in Chart     Comment 2 Notify RN      Patient Active Problem List   Diagnosis Date Noted  . Diabetes mellitus complicating pregnancy 11/21/2013  . Hyperglycemia in pregnancy 10/23/2013  . Vaginal ulcer 10/02/2013  . Previous cesarean  delivery, delivered, with or without mention of  antepartum condition 09/18/2013  . Type 1 diabetes mellitus complicating pregnancy, antepartum 09/18/2013  . Type 1 diabetes mellitus 09/18/2013  . Rheumatoid arthritis 09/18/2013  . Supervision of high-risk pregnancy 09/18/2013  . Hx of preeclampsia, prior pregnancy, currently pregnant 09/18/2013  . Need for maternal serum alpha-protein (MSAFP) screening 09/14/2013    Assessment: Erendida Wrenn is a 23 y.o. G2P0101 at [redacted]w[redacted]d here for patterning, close monitoring and titration of glucose  FWB: Reactive NST, intermittent monitoring  DM: per MFM's recs will: increase novlog with meals to 16 units; and increase NPH to 14 units bid  - pt aware of importance of strict glucose control (states that she never misses her insulin) and increased risk of stillbirth with poorly controlled diabetes  - has fetal ECHO ordered for 8/24 - diabetic diet, diabetic education  ASA - for preE prevention   Thaniel Coluccio ROCIO 11/21/2013, 8:37 PM

## 2013-11-22 DIAGNOSIS — E119 Type 2 diabetes mellitus without complications: Secondary | ICD-10-CM

## 2013-11-22 DIAGNOSIS — O24919 Unspecified diabetes mellitus in pregnancy, unspecified trimester: Principal | ICD-10-CM

## 2013-11-22 LAB — GLUCOSE, CAPILLARY
GLUCOSE-CAPILLARY: 60 mg/dL — AB (ref 70–99)
GLUCOSE-CAPILLARY: 92 mg/dL (ref 70–99)
Glucose-Capillary: 124 mg/dL — ABNORMAL HIGH (ref 70–99)
Glucose-Capillary: 143 mg/dL — ABNORMAL HIGH (ref 70–99)
Glucose-Capillary: 75 mg/dL (ref 70–99)
Glucose-Capillary: 89 mg/dL (ref 70–99)

## 2013-11-22 MED ORDER — NIFEDIPINE 10 MG PO CAPS
10.0000 mg | ORAL_CAPSULE | Freq: Four times a day (QID) | ORAL | Status: DC
Start: 2013-11-22 — End: 2013-11-23
  Administered 2013-11-22 – 2013-11-23 (×3): 10 mg via ORAL
  Filled 2013-11-22 (×3): qty 1

## 2013-11-22 MED ORDER — NIFEDIPINE 10 MG PO CAPS
20.0000 mg | ORAL_CAPSULE | Freq: Once | ORAL | Status: AC
  Administered 2013-11-22: 20 mg via ORAL
  Filled 2013-11-22: qty 2

## 2013-11-22 NOTE — Progress Notes (Signed)
Patient ID: Rebecca Mann, female   DOB: 1990/09/07, 23 y.o.   MRN: [id number] FACULTY PRACTICE ANTEPARTUM(COMPREHENSIVE) NOTE  Rebecca Mann is a 23 y.o. G2P0101 at [redacted]w[redacted]d by best clinical estimate who is admitted for glycemic control.   Fetal presentation is cephalic. Length of Stay:  1  Days  Subjective: Feels well Patient reports the fetal movement as active. Patient reports uterine contraction  activity as none. Patient reports  vaginal bleeding as none. Patient describes fluid per vagina as None.  Vitals:  Blood pressure 106/65, pulse 99, temperature 99.1 F (37.3 C), temperature source Oral, resp. rate 18, height 5\' 2"  (1.575 m), weight 150 lb (68.04 kg), last menstrual period 04/10/2013, SpO2 100.00%. Physical Examination:  General appearance - alert, well appearing, and in no distress Abdomen - gravid, NT Fundal Height:  size equals dates Extremities: extremities normal, atraumatic, no cyanosis or edema  Membranes:intact  Fetal Monitoring:  Baseline: 140 bpm, Variability: Good {> 6 bpm), Accelerations: Reactive and Decelerations: Absent  Labs:  Results for orders placed during the hospital encounter of 11/21/13 (from the past 24 hour(s))  GLUCOSE, CAPILLARY   Collection Time    11/21/13  4:25 PM      Result Value Ref Range   Glucose-Capillary 183 (*) 70 - 99 mg/dL   Comment 1 Documented in Chart     Comment 2 Notify RN    COMPREHENSIVE METABOLIC PANEL   Collection Time    11/21/13  4:50 PM      Result Value Ref Range   Sodium 133 (*) 137 - 147 mEq/L   Potassium 4.0  3.7 - 5.3 mEq/L   Chloride 103  96 - 112 mEq/L   CO2 22  19 - 32 mEq/L   Glucose, Bld 166 (*) 70 - 99 mg/dL   BUN 7  6 - 23 mg/dL   Creatinine, Ser 01/21/14 (*) 0.50 - 1.10 mg/dL   Calcium 8.6  8.4 - 2.53 mg/dL   Total Protein 6.0  6.0 - 8.3 g/dL   Albumin 2.7 (*) 3.5 - 5.2 g/dL   AST 16  0 - 37 U/L   ALT 15  0 - 35 U/L   Alkaline Phosphatase 90  39 - 117 U/L   Total Bilirubin <0.2 (*) 0.3 - 1.2  mg/dL   GFR calc non Af Amer >90  >90 mL/min   GFR calc Af Amer >90  >90 mL/min   Anion gap 8  5 - 15  CBC   Collection Time    11/21/13  4:50 PM      Result Value Ref Range   WBC 6.4  4.0 - 10.5 K/uL   RBC 4.83  3.87 - 5.11 MIL/uL   Hemoglobin 11.0 (*) 12.0 - 15.0 g/dL   HCT 01/21/14 (*) 40.3 - 47.4 %   MCV 68.3 (*) 78.0 - 100.0 fL   MCH 22.8 (*) 26.0 - 34.0 pg   MCHC 33.3  30.0 - 36.0 g/dL   RDW 25.9  56.3 - 87.5 %   Platelets 184  150 - 400 K/uL  TYPE AND SCREEN   Collection Time    11/21/13  4:50 PM      Result Value Ref Range   ABO/RH(D) A POS     Antibody Screen NEG     Sample Expiration 11/24/2013     Unit Number 01/24/2014     Blood Component Type RED CELLS,LR     Unit division 00     Status of Unit ALLOCATED  Transfusion Status OK TO TRANSFUSE     Crossmatch Result COMPATIBLE     Unit Number [id number]     Blood Component Type RED CELLS,LR     Unit division 00     Status of Unit ALLOCATED     Transfusion Status OK TO TRANSFUSE     Crossmatch Result COMPATIBLE    GLUCOSE, CAPILLARY   Collection Time    11/21/13  7:10 PM      Result Value Ref Range   Glucose-Capillary 146 (*) 70 - 99 mg/dL   Comment 1 Documented in Chart     Comment 2 Notify RN    GLUCOSE, CAPILLARY   Collection Time    11/22/13 12:03 AM      Result Value Ref Range   Glucose-Capillary 75  70 - 99 mg/dL  GLUCOSE, CAPILLARY   Collection Time    11/22/13  5:26 AM      Result Value Ref Range   Glucose-Capillary 60 (*) 70 - 99 mg/dL  GLUCOSE, CAPILLARY   Collection Time    11/22/13  5:52 AM      Result Value Ref Range   Glucose-Capillary 89  70 - 99 mg/dL     Medications:  Scheduled . aspirin  81 mg Oral Daily  . docusate sodium  100 mg Oral Daily  . insulin aspart  16 Units Subcutaneous TID WC  . insulin NPH Human  14 Units Subcutaneous BID AC & HS  . prenatal multivitamin  1 tablet Oral Q1200   I have reviewed the patient's current medications.  ASSESSMENT: Patient  Active Problem List   Diagnosis Date Noted  . Diabetes mellitus complicating pregnancy 11/21/2013  . Hyperglycemia in pregnancy 10/23/2013  . Vaginal ulcer 10/02/2013  . Previous cesarean delivery, delivered, with or without mention of antepartum condition 09/18/2013  . Type 1 diabetes mellitus complicating pregnancy, antepartum 09/18/2013  . Type 1 diabetes mellitus 09/18/2013  . Rheumatoid arthritis 09/18/2013  . Supervision of high-risk pregnancy 09/18/2013  . Hx of preeclampsia, prior pregnancy, currently pregnant 09/18/2013  . Need for maternal serum alpha-protein (MSAFP) screening 09/14/2013    PLAN: BS control is improved in the hospital.  Per pt. She tries to follow diet as outpatient.  She was not forthcoming about possible dietary vs. Medication issues.  Re-iterated IUFD possibilities with patient.   Continue inpatient monitoring.  Possibly home tomorrow with close f/u.  Reva Bores, MD 11/22/2013,6:35 AM

## 2013-11-22 NOTE — Progress Notes (Signed)
Nutrition  Pt has received Gestational Diabetic Diet education in [facility] on 6/1, inpatient diet education during last admission on 10/24/13.   Elisabeth Cara M.Odis Luster LDN Neonatal Nutrition Support Specialist/RD III Pager [phone]

## 2013-11-23 LAB — GLUCOSE, CAPILLARY
GLUCOSE-CAPILLARY: 104 mg/dL — AB (ref 70–99)
Glucose-Capillary: 72 mg/dL (ref 70–99)

## 2013-11-23 MED ORDER — INSULIN ASPART 100 UNIT/ML ~~LOC~~ SOLN: 16.0000 [IU] | Freq: Three times a day (TID) | SUBCUTANEOUS | Status: DC

## 2013-11-23 MED ORDER — NIFEDIPINE 10 MG PO CAPS: 10.0000 mg | ORAL_CAPSULE | Freq: Four times a day (QID) | ORAL | Status: DC

## 2013-11-23 NOTE — Discharge Summary (Signed)
Physician Discharge Summary  Patient ID: Rebecca Mann MRN: [id number] DOB/AGE: September 19, 1990 22 y.o.  Admit date: 11/21/2013 Discharge date: 11/23/2013  Admission Diagnoses: [redacted]w[redacted]d Estimated Date of Delivery: 01/15/14  Discharge Diagnoses:  Principal Problem:   Diabetes mellitus complicating pregnancy preterm labor  Discharged Condition: stable  Hospital Course: glucose stabilization  Consults: None  Significant Diagnostic Studies:   Treatments: diabetic patterning  Discharge Exam: Blood pressure 104/59, pulse 85, temperature 98.3 F (36.8 C), temperature source Oral, resp. rate 18, height 5\' 2"  (1.575 m), weight 151 lb (68.493 kg), last menstrual period 04/10/2013, SpO2 99.00%.   Disposition: 01-Home or Self Care  Discharge Instructions   Diet - low sodium heart healthy    Complete by:  As directed      Increase activity slowly    Complete by:  As directed      Lifting restrictions    Complete by:  As directed   No more than 10 pounds     Sexual Activity Restrictions    Complete by:  As directed   No sex until after 36 weeks            Medication List         ACCU-CHEK FASTCLIX LANCETS Misc  Inject 1 each into the skin 4 (four) times daily. 04/12/2013 for testing 4 times daily     aspirin 81 MG tablet  Take 1 tablet (81 mg total) by mouth daily.     glucose blood test strip  Use as instructed to test blood sugar 4 times daily.     insulin aspart 100 UNIT/ML injection  Commonly known as:  novoLOG  Inject 16 Units into the skin 3 (three) times daily with meals.     insulin NPH Human 100 UNIT/ML injection  Commonly known as:  HUMULIN N  Inject 0.1 mLs (10 Units total) into the skin 2 (two) times daily at 8 am and 10 pm.     Insulin Syringe-Needle U-100 25G X 5/8" 1 ML Misc  Commonly known as:  B-D INSULIN SYRINGE 1CC/25G  1 each by Does not apply route 5 (five) times daily.     NIFEdipine 10 MG capsule  Commonly known as:  PROCARDIA  Take 1 capsule (10 mg  total) by mouth every 6 (six) hours.     prenatal multivitamin Tabs tablet  Take 1 tablet by mouth daily at 12 noon.           Follow-up Information   Follow up with [facility] In 1 week.   Contact information:   [address] [place] [phone]      Signed: [phone] 11/23/2013, 7:14 AM

## 2013-11-23 NOTE — Discharge Instructions (Signed)
Blood Glucose Monitoring Monitoring your blood glucose (also know as blood sugar) helps you to manage your diabetes. It also helps you and your health care provider monitor your diabetes and determine how well your treatment plan is working. WHY SHOULD YOU MONITOR YOUR BLOOD GLUCOSE?  It can help you understand how food, exercise, and medicine affect your blood glucose.  It allows you to know what your blood glucose is at any given moment. You can quickly tell if you are having low blood glucose (hypoglycemia) or high blood glucose (hyperglycemia).  It can help you and your health care provider know how to adjust your medicines.  It can help you understand how to manage an illness or adjust medicine for exercise. WHEN SHOULD YOU TEST? Your health care provider will help you decide how often you should check your blood glucose. This may depend on the type of diabetes you have, your diabetes control, or the types of medicines you are taking. Be sure to write down all of your blood glucose readings so that this information can be reviewed with your health care provider. See below for examples of testing times that your health care provider may suggest. Type 1 Diabetes  Test 4 times a day if you are in good control, using an insulin pump, or perform multiple daily injections.  If your diabetes is not well controlled or if you are sick, you may need to monitor more often.  It is a good idea to also monitor:  Before and after exercise.  Between meals and 2 hours after a meal.  Occasionally between 2:00 a.m. and 3:00 a.m. Type 2 Diabetes  It can vary with each person, but generally, if you are on insulin, test 4 times a day.  If you take medicines by mouth (orally), test 2 times a day.  If you are on a controlled diet, test once a day.  If your diabetes is not well controlled or if you are sick, you may need to monitor more often. HOW TO MONITOR YOUR BLOOD GLUCOSE Supplies  Needed  Blood glucose meter.  Test strips for your meter. Each meter has its own strips. You must use the strips that go with your own meter.  A pricking needle (lancet).  A device that holds the lancet (lancing device).  A journal or log book to write down your results. Procedure  Wash your hands with soap and water. Alcohol is not preferred.  Prick the side of your finger (not the tip) with the lancet.  Gently milk the finger until a small drop of blood appears.  Follow the instructions that come with your meter for inserting the test strip, applying blood to the strip, and using your blood glucose meter. Other Areas to Get Blood for Testing Some meters allow you to use other areas of your body (other than your finger) to test your blood. These areas are called alternative sites. The most common alternative sites are:  The forearm.  The thigh.  The back area of the lower leg.  The palm of the hand. The blood flow in these areas is slower. Therefore, the blood glucose values you get may be delayed, and the numbers are different from what you would get from your fingers. Do not use alternative sites if you think you are having hypoglycemia. Your reading will not be accurate. Always use a finger if you are having hypoglycemia. Also, if you cannot feel your lows (hypoglycemia unawareness), always use your fingers for your  blood glucose checks. ADDITIONAL TIPS FOR GLUCOSE MONITORING  Do not reuse lancets.  Always carry your supplies with you.  All blood glucose meters have a 24-hour "hotline" number to call if you have questions or need help.  Adjust (calibrate) your blood glucose meter with a control solution after finishing a few boxes of strips. BLOOD GLUCOSE RECORD KEEPING It is a good idea to keep a daily record or log of your blood glucose readings. Most glucose meters, if not all, keep your glucose records stored in the meter. Some meters come with the ability to download  your records to your home computer. Keeping a record of your blood glucose readings is especially helpful if you are wanting to look for patterns. Make notes to go along with the blood glucose readings because you might forget what happened at that exact time. Keeping good records helps you and your health care provider to work together to achieve good diabetes management.  Document Released: 04/09/2003 Document Revised: 08/21/2013 Document Reviewed: 08/29/2012 [facility] Patient Information 2015 [place], [place]. This information is not intended to replace advice given to you by your health care provider. Make sure you discuss any questions you have with your health care provider. Fetal Movement Counts Patient Name: __________________________________________________ Patient Due Date: ____________________ Performing a fetal movement count is highly recommended in high-risk pregnancies, but it is good for every pregnant woman to do. Your health care provider may ask you to start counting fetal movements at 28 weeks of the pregnancy. Fetal movements often increase:  After eating a full meal.  After physical activity.  After eating or drinking something sweet or cold.  At rest. Pay attention to when you feel the baby is most active. This will help you notice a pattern of your baby's sleep and wake cycles and what factors contribute to an increase in fetal movement. It is important to perform a fetal movement count at the same time each day when your baby is normally most active.  HOW TO COUNT FETAL MOVEMENTS 1. Find a quiet and comfortable area to sit or lie down on your left side. Lying on your left side provides the best blood and oxygen circulation to your baby. 2. Write down the day and time on a sheet of paper or in a journal. 3. Start counting kicks, flutters, swishes, rolls, or jabs in a 2-hour period. You should feel at least 10 movements within 2 hours. 4. If you do not feel 10 movements in 2  hours, wait 2-3 hours and count again. Look for a change in the pattern or not enough counts in 2 hours. SEEK MEDICAL CARE IF:  You feel less than 10 counts in 2 hours, tried twice.  There is no movement in over an hour.  The pattern is changing or taking longer each day to reach 10 counts in 2 hours.  You feel the baby is not moving as he or she usually does. Date: ____________ Movements: ____________ Start time: ____________ Rebecca Mann time: ____________  Date: ____________ Movements: ____________ Start time: ____________ Rebecca Mann time: ____________ Date: ____________ Movements: ____________ Start time: ____________ Rebecca Mann time: ____________ Date: ____________ Movements: ____________ Start time: ____________ Rebecca Mann time: ____________ Date: ____________ Movements: ____________ Start time: ____________ Rebecca Mann time: ____________ Date: ____________ Movements: ____________ Start time: ____________ Rebecca Mann time: ____________ Date: ____________ Movements: ____________ Start time: ____________ Rebecca Mann time: ____________ Date: ____________ Movements: ____________ Start time: ____________ Rebecca Mann time: ____________  Date: ____________ Movements: ____________ Start time: ____________ Rebecca Mann time: ____________ Date: ____________ Movements:  ____________ Start time: ____________ Rebecca Mann time: ____________ Date: ____________ Movements: ____________ Start time: ____________ Rebecca Mann time: ____________ Date: ____________ Movements: ____________ Start time: ____________ Rebecca Mann time: ____________ Date: ____________ Movements: ____________ Start time: ____________ Rebecca Mann time: ____________ Date: ____________ Movements: ____________ Start time: ____________ Rebecca Mann time: ____________ Date: ____________ Movements: ____________ Start time: ____________ Rebecca Mann time: ____________  Date: ____________ Movements: ____________ Start time: ____________ Rebecca Mann time: ____________ Date: ____________ Movements: ____________ Start  time: ____________ Rebecca Mann time: ____________ Date: ____________ Movements: ____________ Start time: ____________ Rebecca Mann time: ____________ Date: ____________ Movements: ____________ Start time: ____________ Rebecca Mann time: ____________ Date: ____________ Movements: ____________ Start time: ____________ Rebecca Mann time: ____________ Date: ____________ Movements: ____________ Start time: ____________ Rebecca Mann time: ____________ Date: ____________ Movements: ____________ Start time: ____________ Rebecca Mann time: ____________  Date: ____________ Movements: ____________ Start time: ____________ Rebecca Mann time: ____________ Date: ____________ Movements: ____________ Start time: ____________ Rebecca Mann time: ____________ Date: ____________ Movements: ____________ Start time: ____________ Rebecca Mann time: ____________ Date: ____________ Movements: ____________ Start time: ____________ Rebecca Mann time: ____________ Date: ____________ Movements: ____________ Start time: ____________ Rebecca Mann time: ____________ Date: ____________ Movements: ____________ Start time: ____________ Rebecca Mann time: ____________ Date: ____________ Movements: ____________ Start time: ____________ Rebecca Mann time: ____________  Date: ____________ Movements: ____________ Start time: ____________ Rebecca Mann time: ____________ Date: ____________ Movements: ____________ Start time: ____________ Rebecca Mann time: ____________ Date: ____________ Movements: ____________ Start time: ____________ Rebecca Mann time: ____________ Date: ____________ Movements: ____________ Start time: ____________ Rebecca Mann time: ____________ Date: ____________ Movements: ____________ Start time: ____________ Rebecca Mann time: ____________ Date: ____________ Movements: ____________ Start time: ____________ Rebecca Mann time: ____________ Date: ____________ Movements: ____________ Start time: ____________ Rebecca Mann time: ____________  Date: ____________ Movements: ____________ Start time: ____________ Rebecca Mann time:  ____________ Date: ____________ Movements: ____________ Start time: ____________ Rebecca Mann time: ____________ Date: ____________ Movements: ____________ Start time: ____________ Rebecca Mann time: ____________ Date: ____________ Movements: ____________ Start time: ____________ Rebecca Mann time: ____________ Date: ____________ Movements: ____________ Start time: ____________ Rebecca Mann time: ____________ Date: ____________ Movements: ____________ Start time: ____________ Rebecca Mann time: ____________ Date: ____________ Movements: ____________ Start time: ____________ Rebecca Mann time: ____________  Date: ____________ Movements: ____________ Start time: ____________ Rebecca Mann time: ____________ Date: ____________ Movements: ____________ Start time: ____________ Rebecca Mann time: ____________ Date: ____________ Movements: ____________ Start time: ____________ Rebecca Mann time: ____________ Date: ____________ Movements: ____________ Start time: ____________ Rebecca Mann time: ____________ Date: ____________ Movements: ____________ Start time: ____________ Rebecca Mann time: ____________ Date: ____________ Movements: ____________ Start time: ____________ Rebecca Mann time: ____________ Date: ____________ Movements: ____________ Start time: ____________ Rebecca Mann time: ____________  Date: ____________ Movements: ____________ Start time: ____________ Rebecca Mann time: ____________ Date: ____________ Movements: ____________ Start time: ____________ Rebecca Mann time: ____________ Date: ____________ Movements: ____________ Start time: ____________ Rebecca Mann time: ____________ Date: ____________ Movements: ____________ Start time: ____________ Rebecca Mann time: ____________ Date: ____________ Movements: ____________ Start time: ____________ Rebecca Mann time: ____________ Date: ____________ Movements: ____________ Start time: ____________ Rebecca Mann time: ____________ Document Released: 05/06/2006 Document Revised: 08/21/2013 Document Reviewed: 02/01/2012 [employer] Patient Information 2015  [employer]. This information is not intended to replace advice given to you by your health care provider. Make sure you discuss any questions you have with your health care provider.

## 2013-11-23 NOTE — H&P (Signed)
Attestation of Attending Supervision of Advanced Practitioner (PA/CNM/NP): Evaluation and management procedures were performed by the Advanced Practitioner under my supervision and collaboration.  I have reviewed the Advanced Practitioner's note and chart, and I agree with the management and plan.  Reva Bores, MD [facility] Attending 11/23/2013 8:40 AM

## 2013-11-24 LAB — TYPE AND SCREEN
ABO/RH(D): A POS
Antibody Screen: NEGATIVE
UNIT DIVISION: 0
Unit division: 0

## 2013-11-27 ENCOUNTER — Encounter: Payer: Medicaid Other | Admitting: Obstetrics & Gynecology

## 2013-11-30 ENCOUNTER — Other Ambulatory Visit: Payer: Self-pay | Admitting: Family Medicine

## 2013-11-30 DIAGNOSIS — Z8751 Personal history of pre-term labor: Secondary | ICD-10-CM

## 2013-11-30 DIAGNOSIS — O24919 Unspecified diabetes mellitus in pregnancy, unspecified trimester: Secondary | ICD-10-CM

## 2013-11-30 DIAGNOSIS — O3421 Maternal care for scar from previous cesarean delivery: Secondary | ICD-10-CM

## 2013-11-30 DIAGNOSIS — O337XX0 Maternal care for disproportion due to other fetal deformities, not applicable or unspecified: Secondary | ICD-10-CM

## 2013-11-30 DIAGNOSIS — O09299 Supervision of pregnancy with other poor reproductive or obstetric history, unspecified trimester: Secondary | ICD-10-CM

## 2013-12-12 ENCOUNTER — Ambulatory Visit (INDEPENDENT_AMBULATORY_CARE_PROVIDER_SITE_OTHER): Payer: Medicaid Other | Admitting: Obstetrics & Gynecology

## 2013-12-12 VITALS — BP 131/80 | HR 87 | Temp 98.4°F | Wt 154.4 lb

## 2013-12-12 DIAGNOSIS — O24013 Pre-existing diabetes mellitus, type 1, in pregnancy, third trimester: Secondary | ICD-10-CM

## 2013-12-12 DIAGNOSIS — E109 Type 1 diabetes mellitus without complications: Secondary | ICD-10-CM

## 2013-12-12 DIAGNOSIS — O24919 Unspecified diabetes mellitus in pregnancy, unspecified trimester: Secondary | ICD-10-CM

## 2013-12-12 DIAGNOSIS — O0991 Supervision of high risk pregnancy, unspecified, first trimester: Secondary | ICD-10-CM

## 2013-12-12 LAB — POCT URINALYSIS DIP (DEVICE)
Bilirubin Urine: NEGATIVE
Glucose, UA: >=1000 mg/dL — AB
Hgb urine dipstick: NEGATIVE
Ketones, ur: NEGATIVE mg/dL
Nitrite: NEGATIVE
PH: 7 (ref 5.0–8.0)
Protein, ur: NEGATIVE mg/dL
Specific Gravity, Urine: 1.01 (ref 1.005–1.030)
UROBILINOGEN UA: 0.2 mg/dL (ref 0.0–1.0)

## 2013-12-12 MED ORDER — INSULIN NPH (HUMAN) (ISOPHANE) 100 UNIT/ML ~~LOC~~ SUSP: SUBCUTANEOUS | Status: DC

## 2013-12-12 NOTE — Progress Notes (Signed)
Non compliant, last visit 7 weeks ago, FBS 62-126, some early am up to 310, [id number], needs NST twice a week, had Nl echo yesterday

## 2013-12-12 NOTE — Progress Notes (Signed)
C/o of "stabbing pain at night" lower pelvic region.

## 2013-12-12 NOTE — Patient Instructions (Signed)
Type 1 or Type 2 Diabetes Mellitus During Pregnancy Diabetes mellitus, often simply referred to as diabetes, is a long-term (chronic) disease. Type 1 diabetes occurs when the islet cells, which are in the pancreas and make the hormone insulin, are destroyed and can no longer make insulin. Type 2 diabetes occurs when the pancreas does not make enough insulin, the cells are less responsive to the insulin that is made (insulin resistance), or both. Insulin is needed to move sugars from food into the tissue cells. The tissue cells use the sugars for energy. The lack of insulin or the lack of normal response to insulin causes excess sugars to build up in the blood instead of going into the tissue cells. As a result, high blood sugar (hyperglycemia) develops.  If blood glucose levels are kept in the normal range both before and during pregnancy, women can have a healthy pregnancy. If your blood glucose levels are not well controlled, there may be risks to you, your unborn baby, and your labor and delivery. Also, there may be risks to your baby once he or she is born.  RISK FACTORS  You are predisposed to developing type 1 diabetes if someone in your family has diabetes and you are exposed to certain environmental triggers.  You have an increased chance of developing type 2 diabetes if you have a family history of diabetes and also have one or more of the following risk factors:  Being overweight.  Having an inactive lifestyle.  Having a history of consistently eating high-calorie foods. SYMPTOMS  Increased thirst (polydipsia).  Increased urination (polyuria).  Increased urination during the night (nocturia).  Weight loss. This weight loss may be rapid.  Frequent, recurring infections.  Tiredness (fatigue).  Weakness.  Vision changes, such as blurred vision.  Fruity smell to your breath.  Abdominal pain.  Nausea or vomiting. DIAGNOSIS  Diabetes is diagnosed when blood glucose levels  are increased. Your blood glucose level may be checked by one or more of the following blood tests:  A fasting blood glucose test. You will not be allowed to eat for at least 8 hours before a blood sample is taken.  A random blood glucose test. Your blood glucose is checked at any time of the day regardless of when you ate.  A hemoglobin A1c blood glucose test. A hemoglobin A1c test provides information about blood glucose control over the previous 3 months.  An oral glucose tolerance test (OGTT). Your blood glucose is measured after you have not eaten (fasted) for 1-3 hours and then after you drink a glucose-containing beverage. An OGTT is usually performed during weeks 24-28 of your pregnancy. TREATMENT   You will need to take diabetes medicine or insulin daily to keep blood glucose levels in the desired range.  You will need to match insulin dosing with exercise and healthy food choices. The treatment goal is to maintain the before-meal (preprandial), bedtime, and overnight blood glucose level at 60-99 mg/dL during pregnancy. The treatment goal is to further maintain the peak after-meal blood sugar (postprandial glucose) level at 100-140 mg/dL.  HOME CARE INSTRUCTIONS   Have your hemoglobin A1c level checked twice a year.  Perform daily blood glucose monitoring as directed by your health care provider. It is common to perform frequent blood glucose monitoring.  Monitor urine ketones when you are sick and as directed by your health care provider.  Take your diabetes medicine and insulin as directed by your health care provider to maintain your blood glucose   level in the desired range.  Never run out of diabetes medicine or insulin. It is needed every day.  Adjust insulin based on your intake of carbohydrates. Carbohydrates can raise blood glucose levels but need to be included in your diet. Carbohydrates provide vitamins, minerals, and fiber, which are an essential part of a healthy  diet. Carbohydrates are found in fruits, vegetables, whole grains, dairy products, legumes, and foods containing added sugars.  Eat healthy foods. Alternate 3 meals with 3 snacks.  Maintain a healthy weight gain. The usual total expected weight gain varies according to your prepregnancy body mass index (BMI).  Carry a medical alert card or wear medical alert jewelry.  Carry a 15-gram carbohydrate snack with you at all times to treat low blood sugar (hypoglycemia). Some examples of 15-gram carbohydrate snacks include:  Glucose tablets, 3 or 4.  Glucose gel, 15-gram tube.  Raisins, 2 Tbsp (24 grams).  Jelly beans, 6.  Animal crackers, 8.  Fruit juice, regular soda, or low-fat milk, 4 ounces (120 mL).  Gummy treats, 9.  Recognize hypoglycemia. Hypoglycemia during pregnancy occurs with blood glucose levels of 60 mg/dL and below. The risk for hypoglycemia increases when fasting or skipping meals, during or after intense exercise, and during sleep. Hypoglycemia symptoms can include:  Tremors or shakes.  Decreased ability to concentrate.  Sweating.  Increased heart rate.  Headache.  Dry mouth.  Hunger.  Irritability.  Anxiety.  Restless sleep.  Altered speech or coordination.  Confusion.  Treat hypoglycemia promptly. If you are alert and able to safely swallow, follow the 15:15 rule:  Take 15-20 grams of rapid-acting glucose or carbohydrate. Rapid-acting options include glucose gel, glucose tablets, or 4 ounces (120 mL) of fruit juice, regular soda, or low-fat milk.  Check your blood glucose level 15 minutes after taking the glucose.  Take an additional 15-20 grams of glucose if the repeat blood glucose level is still 70 mg/dL or below.  Eat a meal or snack within 1 hour once blood glucose levels return to normal.  Engage in at least 30 minutes of physical activity a day or as directed by your health care provider. Ten minutes of physical activity timed 30  minutes after each meal is encouraged to control postprandial blood glucose levels.  Watch for polyuria (excess urination) and polydipsia (feeling extra thirsty), which are early signs of hyperglycemia. An early awareness of hyperglycemia allows for prompt treatment. Treat hyperglycemia as directed by your health care provider.  Adjust your insulin dosing and food intake, as needed, if you start a new exercise or sport.  Follow your sick-day plan any time you are unable to eat or drink as usual.  Avoid tobacco and alcohol use.  Keep all follow-up visits as directed by your health care provider.  Follow the advice of your health care provider regarding your prenatal and post-delivery (postpartum) appointments, meal planning, exercise, medicines, vitamins, blood tests, other medical tests, and physical activities.  Continue daily skin and foot care. Examine your skin and feet daily for cuts, bruises, redness, nail problems, bleeding, blisters, or sores. A foot exam by a health care provider should be done annually.  Brush your teeth and gums at least twice a day and floss at least once a day. Follow up with your dentist regularly.  Schedule an eye exam during the first trimester of your pregnancy or as directed by your health care provider.  Share your diabetes management plan with your workplace or school.  Stay up-to-date with immunizations.    Learn to manage stress.  Obtain ongoing diabetes education and support as needed.  Your health care provider may recommend that you take one low-dose aspirin (81 mg) each day to help prevent high blood pressure during your pregnancy (preeclampsia or eclampsia). You may be at risk for preeclampsia or eclampsia if:  You had preeclampsia or eclampsia during a previous pregnancy.  Your baby did not grow as expected during a previous pregnancy.  You experienced preterm birth with a previous pregnancy.  You experienced a separation of the placenta  from the uterus (placental abruption) during a previous pregnancy.  You experienced the loss of your baby during a previous pregnancy.  You are pregnant with more than one baby.  You have other medical conditions, such as high blood pressure or autoimmune disease. SEEK MEDICAL CARE IF:   You are unable to eat food or drink fluids for more than 6 hours.  You have nausea and vomiting for more than 6 hours.  You have a blood glucose level of 200 mg/dL and you have ketones in your urine.  There is a change in mental status.  You develop vision problems.  You have a persistent headache.  You have upper abdominal pain or discomfort.  You have an additional serious sickness.  You have diarrhea for more than 6 hours.  You have been sick or have had a fever for 2 days and are not getting better. SEEK IMMEDIATE MEDICAL CARE IF:  You have difficulty breathing.  You no longer feel your baby moving.  You are bleeding or have discharge from your vagina.  You start having premature contractions or labor. MAKE SURE YOU:  Understand these instructions.  Will watch your condition.  Will get help right away if you are not doing well or get worse. Document Released: 12/30/2011 Document Revised: 08/21/2013 Document Reviewed: 12/30/2011 [employer] Patient Information 2015 [employer]. This information is not intended to replace advice given to you by your health care provider. Make sure you discuss any questions you have with your health care provider.  

## 2013-12-13 ENCOUNTER — Encounter: Payer: Self-pay | Admitting: *Deleted

## 2013-12-15 ENCOUNTER — Other Ambulatory Visit: Payer: Medicaid Other

## 2013-12-18 ENCOUNTER — Telehealth: Payer: Self-pay | Admitting: *Deleted

## 2013-12-18 ENCOUNTER — Other Ambulatory Visit: Payer: Medicaid Other

## 2013-12-18 NOTE — Telephone Encounter (Signed)
Called pt on home # and left message to call back to the nurse voice mail.  Pt needs to be questioned about missing 2 clinic appts (8/28 and 8/31).  If she calls back today she should be scheduled for Ob fu and NST tomorrow (9/1).  She has [place] scheduled @ [facility] on 9/4 @ 1100.

## 2013-12-19 ENCOUNTER — Ambulatory Visit (HOSPITAL_COMMUNITY): Payer: Medicaid Other

## 2013-12-19 NOTE — Telephone Encounter (Signed)
Called patient at mobile number stating I was calling in regards to the two appts she missed with [place], one on the 28th and the other yesterday. Patient states that the appt on the 28th was too early in the morning and she cannot get here that early but she already called and got her appt rescheduled for Thursday. Told patient that going forward she needs to make sure she makes an appt during a time she can be here and that it is important she comes in for the appts so we can check baby's heart to make sure everything is okay and to also check on her with the doctor. Patient verbalized understanding and is aware of appt on Thursday at 10:30. Patient had no questions

## 2013-12-21 ENCOUNTER — Encounter: Payer: Self-pay | Admitting: Obstetrics and Gynecology

## 2013-12-21 ENCOUNTER — Ambulatory Visit (INDEPENDENT_AMBULATORY_CARE_PROVIDER_SITE_OTHER): Payer: Medicaid Other | Admitting: Obstetrics and Gynecology

## 2013-12-21 VITALS — BP 121/77 | HR 98 | Wt 156.9 lb

## 2013-12-21 DIAGNOSIS — O24013 Pre-existing diabetes mellitus, type 1, in pregnancy, third trimester: Secondary | ICD-10-CM

## 2013-12-21 DIAGNOSIS — O24919 Unspecified diabetes mellitus in pregnancy, unspecified trimester: Secondary | ICD-10-CM

## 2013-12-21 DIAGNOSIS — O09293 Supervision of pregnancy with other poor reproductive or obstetric history, third trimester: Secondary | ICD-10-CM

## 2013-12-21 DIAGNOSIS — E119 Type 2 diabetes mellitus without complications: Secondary | ICD-10-CM

## 2013-12-21 DIAGNOSIS — O09299 Supervision of pregnancy with other poor reproductive or obstetric history, unspecified trimester: Secondary | ICD-10-CM

## 2013-12-21 DIAGNOSIS — O34219 Maternal care for unspecified type scar from previous cesarean delivery: Secondary | ICD-10-CM

## 2013-12-21 DIAGNOSIS — O24913 Unspecified diabetes mellitus in pregnancy, third trimester: Secondary | ICD-10-CM

## 2013-12-21 DIAGNOSIS — E109 Type 1 diabetes mellitus without complications: Secondary | ICD-10-CM

## 2013-12-21 LAB — POCT URINALYSIS DIP (DEVICE)
Bilirubin Urine: NEGATIVE
GLUCOSE, UA: 500 mg/dL — AB
HGB URINE DIPSTICK: NEGATIVE
Leukocytes, UA: NEGATIVE
NITRITE: NEGATIVE
PROTEIN: NEGATIVE mg/dL
Specific Gravity, Urine: 1.015 (ref 1.005–1.030)
Urobilinogen, UA: 0.2 mg/dL (ref 0.0–1.0)
pH: 5.5 (ref 5.0–8.0)

## 2013-12-21 LAB — OB RESULTS CONSOLE GBS: GBS: POSITIVE

## 2013-12-21 LAB — OB RESULTS CONSOLE GC/CHLAMYDIA
Chlamydia: NEGATIVE
Gonorrhea: NEGATIVE

## 2013-12-21 NOTE — Progress Notes (Signed)
Patient is doing well. CBGs fairly well controlled fasting 62-111 2hr pp 120-200 (a few out of range). Continue current insulin regimen. Continue twice weekly NST.  TOLAC consent signed today NST reviewed and reactive

## 2013-12-22 ENCOUNTER — Ambulatory Visit (HOSPITAL_COMMUNITY)
Admission: RE | Admit: 2013-12-22 | Discharge: 2013-12-22 | Disposition: A | Discharge status: Home / Self Care | Payer: Medicaid Other | Source: Ambulatory Visit | Attending: Family Medicine | Admitting: Family Medicine

## 2013-12-22 ENCOUNTER — Encounter (HOSPITAL_COMMUNITY): Payer: Self-pay

## 2013-12-22 DIAGNOSIS — O09299 Supervision of pregnancy with other poor reproductive or obstetric history, unspecified trimester: Secondary | ICD-10-CM | POA: Diagnosis not present

## 2013-12-22 DIAGNOSIS — O337XX0 Maternal care for disproportion due to other fetal deformities, not applicable or unspecified: Secondary | ICD-10-CM

## 2013-12-22 DIAGNOSIS — E109 Type 1 diabetes mellitus without complications: Secondary | ICD-10-CM | POA: Diagnosis not present

## 2013-12-22 DIAGNOSIS — O24919 Unspecified diabetes mellitus in pregnancy, unspecified trimester: Secondary | ICD-10-CM | POA: Insufficient documentation

## 2013-12-22 DIAGNOSIS — O3421 Maternal care for scar from previous cesarean delivery: Secondary | ICD-10-CM

## 2013-12-22 DIAGNOSIS — O34219 Maternal care for unspecified type scar from previous cesarean delivery: Secondary | ICD-10-CM | POA: Insufficient documentation

## 2013-12-22 DIAGNOSIS — Z8751 Personal history of pre-term labor: Secondary | ICD-10-CM

## 2013-12-22 DIAGNOSIS — O24013 Pre-existing diabetes mellitus, type 1, in pregnancy, third trimester: Secondary | ICD-10-CM

## 2013-12-22 LAB — GC/CHLAMYDIA PROBE AMP
CT Probe RNA: NEGATIVE
GC PROBE AMP APTIMA: NEGATIVE

## 2013-12-23 LAB — CULTURE, BETA STREP (GROUP B ONLY)

## 2013-12-24 ENCOUNTER — Inpatient Hospital Stay (HOSPITAL_COMMUNITY)
Admission: AD | Admit: 2013-12-24 | Discharge: 2013-12-24 | Disposition: A | Discharge status: Home / Self Care | Payer: Medicaid Other | Source: Ambulatory Visit | Attending: Family Medicine | Admitting: Family Medicine

## 2013-12-24 ENCOUNTER — Encounter (HOSPITAL_COMMUNITY): Payer: Self-pay | Admitting: *Deleted

## 2013-12-24 ENCOUNTER — Inpatient Hospital Stay (HOSPITAL_COMMUNITY): Payer: Medicaid Other

## 2013-12-24 DIAGNOSIS — E109 Type 1 diabetes mellitus without complications: Secondary | ICD-10-CM | POA: Diagnosis not present

## 2013-12-24 DIAGNOSIS — O409XX0 Polyhydramnios, unspecified trimester, not applicable or unspecified: Secondary | ICD-10-CM | POA: Insufficient documentation

## 2013-12-24 DIAGNOSIS — O4703 False labor before 37 completed weeks of gestation, third trimester: Secondary | ICD-10-CM

## 2013-12-24 DIAGNOSIS — O24919 Unspecified diabetes mellitus in pregnancy, unspecified trimester: Secondary | ICD-10-CM

## 2013-12-24 DIAGNOSIS — O4700 False labor before 37 completed weeks of gestation, unspecified trimester: Secondary | ICD-10-CM | POA: Insufficient documentation

## 2013-12-24 DIAGNOSIS — O403XX1 Polyhydramnios, third trimester, fetus 1: Secondary | ICD-10-CM

## 2013-12-24 DIAGNOSIS — O479 False labor, unspecified: Secondary | ICD-10-CM

## 2013-12-24 DIAGNOSIS — Z87891 Personal history of nicotine dependence: Secondary | ICD-10-CM | POA: Insufficient documentation

## 2013-12-24 DIAGNOSIS — O24013 Pre-existing diabetes mellitus, type 1, in pregnancy, third trimester: Secondary | ICD-10-CM

## 2013-12-24 MED ORDER — ZOLPIDEM TARTRATE ER 12.5 MG PO TBCR: 12.5000 mg | EXTENDED_RELEASE_TABLET | Freq: Every evening | ORAL | Status: DC | PRN

## 2013-12-24 NOTE — MAU Note (Signed)
Ctx x 2 days, worse over the last hour, q 5-6 min, Pain 10/10 Denies LOF and VB, +FM.

## 2013-12-24 NOTE — Progress Notes (Signed)
BPP 8/8, orders to d.c home

## 2013-12-24 NOTE — MAU Note (Signed)
Pt turned to left lateral. EFM and TOCO adjusted

## 2013-12-24 NOTE — Discharge Instructions (Signed)
Third Trimester of Pregnancy °The third trimester is from week 29 through week 42, months 7 through 9. The third trimester is a time when the fetus is growing rapidly. At the end of the ninth month, the fetus is about 20 inches in length and weighs 6-10 pounds.  °BODY CHANGES °Your body goes through many changes during pregnancy. The changes vary from woman to woman.  °· Your weight will continue to increase. You can expect to gain 25-35 pounds (11-16 kg) by the end of the pregnancy. °· You may begin to get stretch marks on your hips, abdomen, and breasts. °· You may urinate more often because the fetus is moving lower into your pelvis and pressing on your bladder. °· You may develop or continue to have heartburn as a result of your pregnancy. °· You may develop constipation because certain hormones are causing the muscles that push waste through your intestines to slow down. °· You may develop hemorrhoids or swollen, bulging veins (varicose veins). °· You may have pelvic pain because of the weight gain and pregnancy hormones relaxing your joints between the bones in your pelvis. Backaches may result from overexertion of the muscles supporting your posture. °· You may have changes in your hair. These can include thickening of your hair, rapid growth, and changes in texture. Some women also have hair loss during or after pregnancy, or hair that feels dry or thin. Your hair will most likely return to normal after your baby is born. °· Your breasts will continue to grow and be tender. A yellow discharge may leak from your breasts called colostrum. °· Your belly button may stick out. °· You may feel short of breath because of your expanding uterus. °· You may notice the fetus "dropping," or moving lower in your abdomen. °· You may have a bloody mucus discharge. This usually occurs a few days to a week before labor begins. °· Your cervix becomes thin and soft (effaced) near your due date. °WHAT TO EXPECT AT YOUR PRENATAL  EXAMS  °You will have prenatal exams every 2 weeks until week 36. Then, you will have weekly prenatal exams. During a routine prenatal visit: °· You will be weighed to make sure you and the fetus are growing normally. °· Your blood pressure is taken. °· Your abdomen will be measured to track your baby's growth. °· The fetal heartbeat will be listened to. °· Any test results from the previous visit will be discussed. °· You may have a cervical check near your due date to see if you have effaced. °At around 36 weeks, your caregiver will check your cervix. At the same time, your caregiver will also perform a test on the secretions of the vaginal tissue. This test is to determine if a type of bacteria, Group B streptococcus, is present. Your caregiver will explain this further. °Your caregiver may ask you: °· What your birth plan is. °· How you are feeling. °· If you are feeling the baby move. °· If you have had any abnormal symptoms, such as leaking fluid, bleeding, severe headaches, or abdominal cramping. °· If you have any questions. °Other tests or screenings that may be performed during your third trimester include: °· Blood tests that check for low iron levels (anemia). °· Fetal testing to check the health, activity level, and growth of the fetus. Testing is done if you have certain medical conditions or if there are problems during the pregnancy. °FALSE LABOR °You may feel small, irregular contractions that   eventually go away. These are called Braxton Hicks contractions, or false labor. Contractions may last for hours, days, or even weeks before true labor sets in. If contractions come at regular intervals, intensify, or become painful, it is best to be seen by your caregiver.  °SIGNS OF LABOR  °· Menstrual-like cramps. °· Contractions that are 5 minutes apart or less. °· Contractions that start on the top of the uterus and spread down to the lower abdomen and back. °· A sense of increased pelvic pressure or back  pain. °· A watery or bloody mucus discharge that comes from the vagina. °If you have any of these signs before the 37th week of pregnancy, call your caregiver right away. You need to go to the hospital to get checked immediately. °HOME CARE INSTRUCTIONS  °· Avoid all smoking, herbs, alcohol, and unprescribed drugs. These chemicals affect the formation and growth of the baby. °· Follow your caregiver's instructions regarding medicine use. There are medicines that are either safe or unsafe to take during pregnancy. °· Exercise only as directed by your caregiver. Experiencing uterine cramps is a good sign to stop exercising. °· Continue to eat regular, healthy meals. °· Wear a good support bra for breast tenderness. °· Do not use hot tubs, steam rooms, or saunas. °· Wear your seat belt at all times when driving. °· Avoid raw meat, uncooked cheese, cat litter boxes, and soil used by cats. These carry germs that can cause birth defects in the baby. °· Take your prenatal vitamins. °· Try taking a stool softener (if your caregiver approves) if you develop constipation. Eat more high-fiber foods, such as fresh vegetables or fruit and whole grains. Drink plenty of fluids to keep your urine clear or pale yellow. °· Take warm sitz baths to soothe any pain or discomfort caused by hemorrhoids. Use hemorrhoid cream if your caregiver approves. °· If you develop varicose veins, wear support hose. Elevate your feet for 15 minutes, 3-4 times a day. Limit salt in your diet. °· Avoid heavy lifting, wear low heal shoes, and practice good posture. °· Rest a lot with your legs elevated if you have leg cramps or low back pain. °· Visit your dentist if you have not gone during your pregnancy. Use a soft toothbrush to brush your teeth and be gentle when you floss. °· A sexual relationship may be continued unless your caregiver directs you otherwise. °· Do not travel far distances unless it is absolutely necessary and only with the approval  of your caregiver. °· Take prenatal classes to understand, practice, and ask questions about the labor and delivery. °· Make a trial run to the hospital. °· Pack your hospital bag. °· Prepare the baby's nursery. °· Continue to go to all your prenatal visits as directed by your caregiver. °SEEK MEDICAL CARE IF: °· You are unsure if you are in labor or if your water has broken. °· You have dizziness. °· You have mild pelvic cramps, pelvic pressure, or nagging pain in your abdominal area. °· You have persistent nausea, vomiting, or diarrhea. °· You have a bad smelling vaginal discharge. °· You have pain with urination. °SEEK IMMEDIATE MEDICAL CARE IF:  °· You have a fever. °· You are leaking fluid from your vagina. °· You have spotting or bleeding from your vagina. °· You have severe abdominal cramping or pain. °· You have rapid weight loss or gain. °· You have shortness of breath with chest pain. °· You notice sudden or extreme swelling   of your face, hands, ankles, feet, or legs.  You have not felt your baby move in over an hour.  You have severe headaches that do not go away with medicine.  You have vision changes. Document Released: 03/31/2001 Document Revised: 04/11/2013 Document Reviewed: 06/07/2012 [facility] Patient Information 2015 [place], [place]. This information is not intended to replace advice given to you by your health care provider. Make sure you discuss any questions you have with your health care provider. Trial of Labor After Cesarean Delivery Information A trial of labor after cesarean delivery (TOLAC) is when a woman tries to give birth vaginally after a previous cesarean delivery. TOLAC may be a safe and appropriate option for you depending on your medical history and other risk factors. When TOLAC is successful and you are able to have a vaginal delivery, this is called a vaginal birth after cesarean delivery (VBAC).  CANDIDATES FOR TOLAC TOLAC is possible for some women who:  Have  undergone one or two prior cesarean deliveries in which the incision of the uterus was horizontal (low transverse).  Are carrying twins and have had one prior low transverse incision during a cesarean delivery.  Do not have a vertical (classical) uterine scar.  Have not had a tear in the wall of their uterus (uterine rupture). TOLAC is also supported for women who meet appropriate criteria and:  Are under the age of 40 years.  Are tall and have a body mass index (BMI) of less than 30.  Have an unknown uterine scar.  Give birth in a facility equipped to handle an emergency cesarean delivery. This team should be able to handle possible complications such as a uterine rupture.  Have thorough counseling about the benefits and risks of TOLAC.  Have discussed future pregnancy plans with their health care provider.  Plan to have several more pregnancies. MOST SUCCESSFUL CANDIDATES FOR TOLAC:  Have had a successful vaginal delivery before or after their cesarean delivery.  Experience labor that begins naturally on or before the due date (40 weeks of gestation).  Do not have a very large (macrosomic) baby.   Had a prior cesarean delivery but are not currently experiencing factors that would prompt a cesarean delivery (such as a breech position).  Had only one prior cesarean delivery.  Had a prior cesarean delivery that was performed early in labor and not after full cervical dilation. TOLAC may be most appropriate for women who meet the above guidelines and who plan to have more pregnancies. TOLAC is not recommended for home births. LEAST SUCCESSFUL CANDIDATES FOR TOLAC:  Have an induced labor with an unfavorable cervix. An unfavorable cervix is when the cervix is not dilating enough (among other factors).  Have never had a vaginal delivery.  Have had more than two cesarean deliveries.  Have a pregnancy at more than 40 weeks of gestation.  Are pregnant with a baby with a  suspected weight greater than 4,000 grams (8 pounds) and who have no prior history of a vaginal delivery.  Have closely spaced pregnancies. SUGGESTED BENEFITS OF TOLAC  You may have a faster recovery time.  You may have a shorter stay in the hospital.  You may have less pain and fewer problems than with a cesarean delivery. Women who have a cesarean delivery have a higher chance of needing blood or getting a fever, an infection, or a blood clot in the legs. SUGGESTED RISKS OF TOLAC The highest risk of complications happens to women who attempt a TOLAC  and fail. A failed TOLAC results in an unplanned cesarean delivery. Risks related to [facility] or repeat cesarean deliveries include:   Blood loss.  Infection.  Blood clot.  Injury to surrounding tissues or organs.  Having to remove the uterus (hysterectomy).  Potential problems with the placenta (such as placenta previa or placenta accreta) in future pregnancies. Although very rare, the main concerns with TOLAC are:  Rupture of the uterine scar from a past cesarean delivery.  Needing an emergency cesarean delivery.  Having a bad outcome for the baby (perinatal morbidity). FOR MORE INFORMATION [employer] of [employer]nec[employer]: www.acog.org [employer]: www.midwife.org Document Released: 12/23/2010 Document Revised: 01/25/2013 Document Reviewed: 09/26/2012 [facility] Patient Information 2015 [place], [place]. This information is not intended to replace advice given to you by your health care provider. Make sure you discuss any questions you have with your health care provider.

## 2013-12-24 NOTE — MAU Provider Note (Signed)
Chief Complaint:  Contractions   First Provider Initiated Contact with Patient 12/24/13 [phone]      HPI: Rebecca Mann is a 23 y.o. G2P0101 at [redacted]w[redacted]d who presents to maternity admissions reporting UCs x 2 days, increasingly uncomfortable UCs since 3 am. Has not slept x 2 nights.  States self check CBG 180 while here but has not had AM insulin. IOL is planned for 38 wks.  Denies contractions, leakage of fluid or vaginal bleeding. Good fetal movement.   Pregnancy Course: HRC for Type1 DM, RA, prev C/S dsiring TOLAC, hx preE. [place] at [redacted]w[redacted]d: high normal AF, EFW 7#12, >90th percentile  Past Medical History: Past Medical History  Diagnosis Date  . Pregnancy induced hypertension   . Diabetes mellitus without complication   . Rheumatoid arthritis   . Preterm labor     Past obstetric history: OB History  Gravida Para Term Preterm AB SAB TAB Ectopic Multiple Living  2 1  1      1     # Outcome Date GA Lbr Len/2nd Weight Sex Delivery Anes PTL Lv  2 CUR           1 PRE 05/03/11 [redacted]w[redacted]d  3.289 kg (7 lb 4 oz)  CS        Comments: juvenille diabetes, yeast infection, LGA, induced labor, UTI       Past Surgical History: Past Surgical History  Procedure Laterality Date  . Cesarean section       Family History: Family History  Problem Relation Age of Onset  . Hypertension Mother   . Diabetes Mother   . Diabetes Brother   . Diabetes Maternal Grandmother   . Diabetes Maternal Grandfather     Social History: History  Substance Use Topics  . Smoking status: Former Smoker    Quit date: 09/19/2011  . Smokeless tobacco: Never Used  . Alcohol Use: No    Allergies:  Allergies  Allergen Reactions  . Fish Allergy Anaphylaxis and Rash    Meds:  Prescriptions prior to admission  Medication Sig Dispense Refill  . ACCU-CHEK FASTCLIX LANCETS MISC Inject 1 each into the skin 4 (four) times daily. 11/19/2011 for testing 4 times daily  102 each  12  . aspirin 81 MG tablet Take 1 tablet (81 mg  total) by mouth daily.  30 tablet  4  . glucose blood test strip Use as instructed to test blood sugar 4 times daily.  50 each  12  . insulin aspart (NOVOLOG) 100 UNIT/ML injection Inject 16 Units into the skin 3 (three) times daily with meals.  10 mL  11  . insulin NPH Human (HUMULIN N) 100 UNIT/ML injection 16 units [employer] QAM and 14 units [place] at 10 PM  10 mL  11  . Insulin Syringe-Needle U-100 (B-D INSULIN SYRINGE 1CC/25G) 25G X 5/8" 1 ML MISC 1 each by Does not apply route 5 (five) times daily.  100 each  3  . NIFEdipine (PROCARDIA) 10 MG capsule Take 1 capsule (10 mg total) by mouth every 6 (six) hours.  120 capsule  1  . Prenatal Vit-Fe Fumarate-FA (PRENATAL MULTIVITAMIN) TABS tablet Take 1 tablet by mouth daily at 12 noon.        ROS: Pertinent findings in history of present illness.  Physical Exam  Blood pressure 126/80, pulse 76, temperature 98.6 F (37 C), temperature source Oral, resp. rate 20, height 5\' 1"  (1.549 m), weight 71.033 kg (156 lb 9.6 oz), last menstrual period 04/10/2013. Filed Vitals:  12/24/13 0350 12/24/13 0409  BP: 123/68 126/80  Pulse: 78 76  Temp: 98.4 F (36.9 C) 98.6 F (37 C)  TempSrc:  Oral  Resp: 20 20  Height: 5\' 1"  (1.549 m)   Weight: 71.033 kg (156 lb 9.6 oz)    GENERAL: Well-developed, well-nourished female in no acute distress.  HEENT: normocephalic HEART: normal rate RESP: normal effort ABDOMEN: Soft, nontender b/t UCs, gravid term-size, c/w polyhydramnios, UCs palpate mild EXTREMITIES: Nontender, no edema NEURO: alert and oriented SPECULUM EXAM: NEFG, physiologic discharge, no blood, cervix clean Dilation: Fingertip Effacement (%): Thick Cervical Position: Posterior Station: -3 Exam by:: [email or web address], RNC No change in exam after return from [facility] per [place], RN  FHT:  Baseline 135 , moderate variability, accelerations present, occasional mild variable deceleration 2-3 hr ago. Neg CST Contractions: q 2-4 mins   Labs: No results found for  this or any previous visit (from the past 24 hour(s)).  Imaging:  Joana Reamer: BPP 8/8, polyhydramnios  MAU Course: Prolonged EFM, observation Dr. [place] aware  Assessment: 1. Polyhydramnios, third trimester, antepartum condition or complication, fetus 1   2. False labor before 37 completed weeks of gestation in third trimester   3. Type 1 diabetes mellitus complicating pregnancy, antepartum, third trimester   G2P0101 at [redacted]w[redacted]d with PTUCs due to polyhydramnios, not in labor Category 1 FHR  Plan: Discharge home Labor precautions and fetal kick counts    Medication List         ACCU-CHEK FASTCLIX LANCETS Misc  Inject 1 each into the skin 4 (four) times daily. [redacted]w[redacted]d for testing 4 times daily     aspirin 81 MG tablet  Take 1 tablet (81 mg total) by mouth daily.     glucose blood test strip  Use as instructed to test blood sugar 4 times daily.     insulin aspart 100 UNIT/ML injection  Commonly known as:  novoLOG  Inject 16 Units into the skin 3 (three) times daily with meals.     insulin NPH Human 100 UNIT/ML injection  Commonly known as:  HUMULIN N  16 units [place] QAM and 14 units  at 10 PM     Insulin Syringe-Needle U-100 25G X 5/8" 1 ML Misc  Commonly known as:  B-D INSULIN SYRINGE 1CC/25G  1 each by Does not apply route 5 (five) times daily.     NIFEdipine 10 MG capsule  Commonly known as:  PROCARDIA  Take 1 capsule (10 mg total) by mouth every 6 (six) hours.     prenatal multivitamin Tabs tablet  Take 1 tablet by mouth daily at 12 noon.     zolpidem 12.5 MG CR tablet  Commonly known as:  AMBIEN CR  Take 1 tablet (12.5 mg total) by mouth at bedtime as needed for sleep.       Follow-up Information   Follow up with [facility]. (As needed, If symptoms worsen, and as previously scheduled.)    Specialty:  Home Health Services   Contact information:   Care Coordination for [facility] [address] [place]  [place] [phone]       Follow up with [facility] In 2 days.      [phone], CNM 12/24/2013 8:35 AM

## 2013-12-24 NOTE — MAU Note (Signed)
Contractions yesterday but stronger since 0300. Denies bleeding or LOF

## 2013-12-24 NOTE — MAU Provider Note (Signed)
Attestation of Attending Supervision of Advanced Practitioner (CNM/NP): Evaluation and management procedures were performed by the Advanced Practitioner under my supervision and collaboration. I have reviewed the Advanced Practitioner's note and chart, and I agree with the management and plan.  Laurieanne Galloway H. 4:12 PM   

## 2013-12-26 ENCOUNTER — Encounter: Payer: Self-pay | Admitting: General Practice

## 2013-12-26 ENCOUNTER — Ambulatory Visit (INDEPENDENT_AMBULATORY_CARE_PROVIDER_SITE_OTHER): Payer: Medicaid Other | Admitting: *Deleted

## 2013-12-26 DIAGNOSIS — O24013 Pre-existing diabetes mellitus, type 1, in pregnancy, third trimester: Secondary | ICD-10-CM

## 2013-12-26 DIAGNOSIS — E109 Type 1 diabetes mellitus without complications: Secondary | ICD-10-CM

## 2013-12-26 DIAGNOSIS — O24919 Unspecified diabetes mellitus in pregnancy, unspecified trimester: Secondary | ICD-10-CM

## 2013-12-26 NOTE — Progress Notes (Signed)
Category 1 tracing with baseline in 140s.  Moderate variability, multiple accelerations, no decelerations.  

## 2013-12-27 ENCOUNTER — Encounter: Payer: Self-pay | Admitting: Obstetrics and Gynecology

## 2013-12-28 ENCOUNTER — Ambulatory Visit (INDEPENDENT_AMBULATORY_CARE_PROVIDER_SITE_OTHER): Payer: Medicaid Other | Admitting: Family Medicine

## 2013-12-28 VITALS — BP 122/76 | HR 89 | Wt 156.2 lb

## 2013-12-28 DIAGNOSIS — O0993 Supervision of high risk pregnancy, unspecified, third trimester: Secondary | ICD-10-CM

## 2013-12-28 DIAGNOSIS — O24919 Unspecified diabetes mellitus in pregnancy, unspecified trimester: Secondary | ICD-10-CM

## 2013-12-28 DIAGNOSIS — O24013 Pre-existing diabetes mellitus, type 1, in pregnancy, third trimester: Secondary | ICD-10-CM

## 2013-12-28 DIAGNOSIS — E109 Type 1 diabetes mellitus without complications: Secondary | ICD-10-CM

## 2013-12-28 DIAGNOSIS — Z23 Encounter for immunization: Secondary | ICD-10-CM

## 2013-12-28 DIAGNOSIS — O0990 Supervision of high risk pregnancy, unspecified, unspecified trimester: Secondary | ICD-10-CM

## 2013-12-28 LAB — US OB FOLLOW UP

## 2013-12-28 MED ORDER — INSULIN NPH (HUMAN) (ISOPHANE) 100 UNIT/ML ~~LOC~~ SUSP: SUBCUTANEOUS | Status: DC

## 2013-12-28 MED ORDER — ZOLPIDEM TARTRATE ER 12.5 MG PO TBCR: 12.5000 mg | EXTENDED_RELEASE_TABLET | Freq: Every evening | ORAL | Status: DC | PRN

## 2013-12-28 NOTE — Patient Instructions (Signed)
Labor Induction  Labor induction is when steps are taken to cause a pregnant woman to begin the labor process. Most women go into labor on their own between 37 weeks and 42 weeks of the pregnancy. When this does not happen or when there is a medical need, methods may be used to induce labor. Labor induction causes a pregnant woman's uterus to contract. It also causes the cervix to soften (ripen), open (dilate), and thin out (efface). Usually, labor is not induced before 39 weeks of the pregnancy unless there is a problem with the baby or mother.  Before inducing labor, your health care provider will consider a number of factors, including the following:  The medical condition of you and the baby.   How many weeks along you are.   The status of the baby's lung maturity.   The condition of the cervix.   The position of the baby.  WHAT ARE THE REASONS FOR LABOR INDUCTION? Labor may be induced for the following reasons:  The health of the baby or mother is at risk.   The pregnancy is overdue by 1 week or more.   The water breaks but labor does not start on its own.   The mother has a health condition or serious illness, such as high blood pressure, infection, placental abruption, or diabetes.  The amniotic fluid amounts are low around the baby.   The baby is distressed.  Convenience or wanting the baby to be born on a certain date is not a reason for inducing labor. WHAT METHODS ARE USED FOR LABOR INDUCTION? Several methods of labor induction may be used, such as:   Prostaglandin medicine. This medicine causes the cervix to dilate and ripen. The medicine will also start contractions. It can be taken by mouth or by inserting a suppository into the vagina.   Inserting a thin tube (catheter) with a balloon on the end into the vagina to dilate the cervix. Once inserted, the balloon is expanded with water, which causes the cervix to open.   Stripping the membranes. Your health  care provider separates amniotic sac tissue from the cervix, causing the cervix to be stretched and causing the release of a hormone called progesterone. This may cause the uterus to contract. It is often done during an office visit. You will be sent home to wait for the contractions to begin. You will then come in for an induction.   Breaking the water. Your health care provider makes a hole in the amniotic sac using a small instrument. Once the amniotic sac breaks, contractions should begin. This may still take hours to see an effect.   Medicine to trigger or strengthen contractions. This medicine is given through an IV access tube inserted into a vein in your arm.  All of the methods of induction, besides stripping the membranes, will be done in the hospital. Induction is done in the hospital so that you and the baby can be carefully monitored.  HOW LONG DOES IT TAKE FOR LABOR TO BE INDUCED? Some inductions can take up to 2-3 days. Depending on the cervix, it usually takes less time. It takes longer when you are induced early in the pregnancy or if this is your first pregnancy. If a mother is still pregnant and the induction has been going on for 2-3 days, either the mother will be sent home or a cesarean delivery will be needed. WHAT ARE THE RISKS ASSOCIATED WITH LABOR INDUCTION? Some of the risks of induction   include:   Changes in fetal heart rate, such as too high, too low, or erratic.   Fetal distress.   Chance of infection for the mother and baby.   Increased chance of having a cesarean delivery.   Breaking off (abruption) of the placenta from the uterus (rare).   Uterine rupture (very rare).  When induction is needed for medical reasons, the benefits of induction may outweigh the risks. WHAT ARE SOME REASONS FOR NOT INDUCING LABOR? Labor induction should not be done if:   It is shown that your baby does not tolerate labor.   You have had previous surgeries on your  uterus, such as a myomectomy or the removal of fibroids.   Your placenta lies very low in the uterus and blocks the opening of the cervix (placenta previa).   Your baby is not in a head-down position.   The umbilical cord drops down into the birth canal in front of the baby. This could cut off the baby's blood and oxygen supply.   You have had a previous cesarean delivery.   There are unusual circumstances, such as the baby being extremely premature.  Document Released: 08/26/2006 Document Revised: 12/07/2012 Document Reviewed: 11/03/2012 [employer] Patient Information 2015 [employer]. This information is not intended to replace advice given to you by your health care provider. Make sure you discuss any questions you have with your health care provider.  

## 2013-12-28 NOTE — Progress Notes (Signed)
.  Category 1 tracing with baseline in 150s.  Moderate variability, multiple accelerations, no decelerations.  Ctxs q 4 min noted. [place] 9/4 - wt 3520. BPP 8/8.  No other complaints or concerns.   Multiple PP CBGs > 120.  Increase NPH by 2 units. Induction scheduled for 9/21.

## 2013-12-28 NOTE — Progress Notes (Signed)
NST/AFI/OBF.  Pt is inquiring about IOL date.  IOL scheduled for 01/08/14 @ 0630.

## 2014-01-01 ENCOUNTER — Ambulatory Visit (INDEPENDENT_AMBULATORY_CARE_PROVIDER_SITE_OTHER): Payer: Medicaid Other | Admitting: *Deleted

## 2014-01-01 VITALS — BP 132/65 | HR 82

## 2014-01-01 DIAGNOSIS — E109 Type 1 diabetes mellitus without complications: Secondary | ICD-10-CM

## 2014-01-01 DIAGNOSIS — O24013 Pre-existing diabetes mellitus, type 1, in pregnancy, third trimester: Secondary | ICD-10-CM

## 2014-01-01 DIAGNOSIS — O24919 Unspecified diabetes mellitus in pregnancy, unspecified trimester: Secondary | ICD-10-CM

## 2014-01-01 NOTE — Progress Notes (Signed)
.  Category 1 tracing with baseline in 150s.  Moderate variability, multiple accelerations, no decelerations.  

## 2014-01-02 ENCOUNTER — Telehealth (HOSPITAL_COMMUNITY): Payer: Self-pay | Admitting: *Deleted

## 2014-01-02 ENCOUNTER — Inpatient Hospital Stay (HOSPITAL_COMMUNITY)
Admission: AD | Admit: 2014-01-02 | Discharge: 2014-01-07 | DRG: 765 | Disposition: A | Discharge status: Home / Self Care | Payer: Medicaid Other | Source: Ambulatory Visit | Attending: Obstetrics & Gynecology | Admitting: Obstetrics & Gynecology

## 2014-01-02 ENCOUNTER — Encounter (HOSPITAL_COMMUNITY): Payer: Self-pay | Admitting: *Deleted

## 2014-01-02 DIAGNOSIS — N736 Female pelvic peritoneal adhesions (postinfective): Secondary | ICD-10-CM | POA: Diagnosis present

## 2014-01-02 DIAGNOSIS — D649 Anemia, unspecified: Secondary | ICD-10-CM | POA: Diagnosis present

## 2014-01-02 DIAGNOSIS — E109 Type 1 diabetes mellitus without complications: Secondary | ICD-10-CM | POA: Diagnosis present

## 2014-01-02 DIAGNOSIS — O324XX0 Maternal care for high head at term, not applicable or unspecified: Secondary | ICD-10-CM | POA: Diagnosis present

## 2014-01-02 DIAGNOSIS — O2432 Unspecified pre-existing diabetes mellitus in childbirth: Secondary | ICD-10-CM | POA: Diagnosis present

## 2014-01-02 DIAGNOSIS — N765 Ulceration of vagina: Secondary | ICD-10-CM

## 2014-01-02 DIAGNOSIS — O0993 Supervision of high risk pregnancy, unspecified, third trimester: Secondary | ICD-10-CM

## 2014-01-02 DIAGNOSIS — Z87891 Personal history of nicotine dependence: Secondary | ICD-10-CM

## 2014-01-02 DIAGNOSIS — Z8249 Family history of ischemic heart disease and other diseases of the circulatory system: Secondary | ICD-10-CM

## 2014-01-02 DIAGNOSIS — Z794 Long term (current) use of insulin: Secondary | ICD-10-CM

## 2014-01-02 DIAGNOSIS — O339 Maternal care for disproportion, unspecified: Secondary | ICD-10-CM | POA: Diagnosis present

## 2014-01-02 DIAGNOSIS — O34219 Maternal care for unspecified type scar from previous cesarean delivery: Secondary | ICD-10-CM

## 2014-01-02 DIAGNOSIS — Z22330 Carrier of Group B streptococcus: Secondary | ICD-10-CM

## 2014-01-02 DIAGNOSIS — O330 Maternal care for disproportion due to deformity of maternal pelvic bones: Secondary | ICD-10-CM | POA: Diagnosis present

## 2014-01-02 DIAGNOSIS — O09293 Supervision of pregnancy with other poor reproductive or obstetric history, third trimester: Secondary | ICD-10-CM

## 2014-01-02 DIAGNOSIS — O24013 Pre-existing diabetes mellitus, type 1, in pregnancy, third trimester: Secondary | ICD-10-CM

## 2014-01-02 DIAGNOSIS — Z833 Family history of diabetes mellitus: Secondary | ICD-10-CM

## 2014-01-02 DIAGNOSIS — O9989 Other specified diseases and conditions complicating pregnancy, childbirth and the puerperium: Secondary | ICD-10-CM

## 2014-01-02 DIAGNOSIS — O4290 Premature rupture of membranes, unspecified as to length of time between rupture and onset of labor, unspecified weeks of gestation: Secondary | ICD-10-CM | POA: Diagnosis present

## 2014-01-02 DIAGNOSIS — O9902 Anemia complicating childbirth: Secondary | ICD-10-CM | POA: Diagnosis present

## 2014-01-02 DIAGNOSIS — O99892 Other specified diseases and conditions complicating childbirth: Secondary | ICD-10-CM | POA: Diagnosis present

## 2014-01-02 LAB — POCT FERN TEST: POCT FERN TEST: POSITIVE

## 2014-01-02 NOTE — MAU Note (Signed)
SROM at 1040pm-clear fluid. Denies vag bleeding. Contractions every min. Previous c/s but plans to [facility]

## 2014-01-02 NOTE — Telephone Encounter (Signed)
Preadmission screen  

## 2014-01-03 ENCOUNTER — Inpatient Hospital Stay (HOSPITAL_COMMUNITY): Payer: Medicaid Other | Admitting: Anesthesiology

## 2014-01-03 ENCOUNTER — Encounter (HOSPITAL_COMMUNITY): Payer: Self-pay

## 2014-01-03 DIAGNOSIS — O4290 Premature rupture of membranes, unspecified as to length of time between rupture and onset of labor, unspecified weeks of gestation: Secondary | ICD-10-CM | POA: Diagnosis present

## 2014-01-03 DIAGNOSIS — Z8249 Family history of ischemic heart disease and other diseases of the circulatory system: Secondary | ICD-10-CM | POA: Diagnosis not present

## 2014-01-03 DIAGNOSIS — D649 Anemia, unspecified: Secondary | ICD-10-CM | POA: Diagnosis present

## 2014-01-03 DIAGNOSIS — O34219 Maternal care for unspecified type scar from previous cesarean delivery: Secondary | ICD-10-CM | POA: Diagnosis present

## 2014-01-03 DIAGNOSIS — Z794 Long term (current) use of insulin: Secondary | ICD-10-CM | POA: Diagnosis not present

## 2014-01-03 DIAGNOSIS — Z833 Family history of diabetes mellitus: Secondary | ICD-10-CM | POA: Diagnosis not present

## 2014-01-03 DIAGNOSIS — O9902 Anemia complicating childbirth: Secondary | ICD-10-CM | POA: Diagnosis present

## 2014-01-03 DIAGNOSIS — E119 Type 2 diabetes mellitus without complications: Secondary | ICD-10-CM | POA: Diagnosis not present

## 2014-01-03 DIAGNOSIS — O99892 Other specified diseases and conditions complicating childbirth: Secondary | ICD-10-CM | POA: Diagnosis present

## 2014-01-03 DIAGNOSIS — O330 Maternal care for disproportion due to deformity of maternal pelvic bones: Secondary | ICD-10-CM | POA: Diagnosis present

## 2014-01-03 DIAGNOSIS — O99891 Other specified diseases and conditions complicating pregnancy: Secondary | ICD-10-CM | POA: Diagnosis present

## 2014-01-03 DIAGNOSIS — N736 Female pelvic peritoneal adhesions (postinfective): Secondary | ICD-10-CM | POA: Diagnosis present

## 2014-01-03 DIAGNOSIS — E109 Type 1 diabetes mellitus without complications: Secondary | ICD-10-CM | POA: Diagnosis present

## 2014-01-03 DIAGNOSIS — O2432 Unspecified pre-existing diabetes mellitus in childbirth: Secondary | ICD-10-CM | POA: Diagnosis present

## 2014-01-03 DIAGNOSIS — Z87891 Personal history of nicotine dependence: Secondary | ICD-10-CM | POA: Diagnosis not present

## 2014-01-03 DIAGNOSIS — Z22330 Carrier of Group B streptococcus: Secondary | ICD-10-CM | POA: Diagnosis not present

## 2014-01-03 DIAGNOSIS — O339 Maternal care for disproportion, unspecified: Secondary | ICD-10-CM | POA: Diagnosis present

## 2014-01-03 DIAGNOSIS — O324XX0 Maternal care for high head at term, not applicable or unspecified: Secondary | ICD-10-CM | POA: Diagnosis present

## 2014-01-03 LAB — GLUCOSE, CAPILLARY
GLUCOSE-CAPILLARY: 67 mg/dL — AB (ref 70–99)
GLUCOSE-CAPILLARY: 222 mg/dL — AB (ref 70–99)
GLUCOSE-CAPILLARY: 84 mg/dL (ref 70–99)
GLUCOSE-CAPILLARY: 106 mg/dL — AB (ref 70–99)
GLUCOSE-CAPILLARY: 88 mg/dL (ref 70–99)
GLUCOSE-CAPILLARY: 97 mg/dL (ref 70–99)
Glucose-Capillary: 100 mg/dL — ABNORMAL HIGH (ref 70–99)
Glucose-Capillary: 101 mg/dL — ABNORMAL HIGH (ref 70–99)
Glucose-Capillary: 107 mg/dL — ABNORMAL HIGH (ref 70–99)
Glucose-Capillary: 123 mg/dL — ABNORMAL HIGH (ref 70–99)
Glucose-Capillary: 123 mg/dL — ABNORMAL HIGH (ref 70–99)
Glucose-Capillary: 154 mg/dL — ABNORMAL HIGH (ref 70–99)
Glucose-Capillary: 175 mg/dL — ABNORMAL HIGH (ref 70–99)
Glucose-Capillary: 197 mg/dL — ABNORMAL HIGH (ref 70–99)
Glucose-Capillary: 65 mg/dL — ABNORMAL LOW (ref 70–99)
Glucose-Capillary: 86 mg/dL (ref 70–99)
Glucose-Capillary: 92 mg/dL (ref 70–99)

## 2014-01-03 LAB — CBC
HEMATOCRIT: 39.2 % (ref 36.0–46.0)
Hemoglobin: 13.2 g/dL (ref 12.0–15.0)
MCH: 22.5 pg — ABNORMAL LOW (ref 26.0–34.0)
MCHC: 33.7 g/dL (ref 30.0–36.0)
MCV: 66.9 fL — ABNORMAL LOW (ref 78.0–100.0)
Platelets: 177 10*3/uL (ref 150–400)
RBC: 5.86 MIL/uL — ABNORMAL HIGH (ref 3.87–5.11)
RDW: 14 % (ref 11.5–15.5)
WBC: 7.4 10*3/uL (ref 4.0–10.5)

## 2014-01-03 LAB — TYPE AND SCREEN
ABO/RH(D): A POS
Antibody Screen: NEGATIVE

## 2014-01-03 LAB — RPR

## 2014-01-03 MED ORDER — PENICILLIN G POTASSIUM 5000000 UNITS IJ SOLR
5.0000 10*6.[IU] | Freq: Once | INTRAVENOUS | Status: AC
  Administered 2014-01-03: 5 10*6.[IU] via INTRAVENOUS
  Filled 2014-01-03: qty 5

## 2014-01-03 MED ORDER — SODIUM CHLORIDE 0.9 % IV SOLN
INTRAVENOUS | Status: DC
  Administered 2014-01-03: 1.6 [IU]/h via INTRAVENOUS
  Filled 2014-01-03: qty 2.5

## 2014-01-03 MED ORDER — PHENYLEPHRINE 40 MCG/ML (10ML) SYRINGE FOR IV PUSH (FOR BLOOD PRESSURE SUPPORT): 80.0000 ug | PREFILLED_SYRINGE | INTRAVENOUS | Status: DC | PRN

## 2014-01-03 MED ORDER — PENICILLIN G POTASSIUM 5000000 UNITS IJ SOLR
2.5000 10*6.[IU] | INTRAVENOUS | Status: DC
  Administered 2014-01-03 – 2014-01-04 (×6): 2.5 10*6.[IU] via INTRAVENOUS
  Filled 2014-01-03 (×10): qty 2.5

## 2014-01-03 MED ORDER — PHENYLEPHRINE 40 MCG/ML (10ML) SYRINGE FOR IV PUSH (FOR BLOOD PRESSURE SUPPORT)
80.0000 ug | PREFILLED_SYRINGE | INTRAVENOUS | Status: DC | PRN
  Filled 2014-01-03: qty 10

## 2014-01-03 MED ORDER — LACTATED RINGERS IV SOLN
500.0000 mL | Freq: Once | INTRAVENOUS | Status: AC
  Administered 2014-01-03: 500 mL via INTRAVENOUS

## 2014-01-03 MED ORDER — OXYTOCIN 40 UNITS IN LACTATED RINGERS INFUSION - SIMPLE MED
1.0000 m[IU]/min | INTRAVENOUS | Status: DC
  Administered 2014-01-03: 2 m[IU]/min via INTRAVENOUS
  Filled 2014-01-03: qty 1000

## 2014-01-03 MED ORDER — CITRIC ACID-SODIUM CITRATE 334-500 MG/5ML PO SOLN
30.0000 mL | ORAL | Status: DC | PRN
  Administered 2014-01-04: 30 mL via ORAL
  Filled 2014-01-03: qty 15

## 2014-01-03 MED ORDER — ACETAMINOPHEN 325 MG PO TABS: 650.0000 mg | ORAL_TABLET | ORAL | Status: DC | PRN

## 2014-01-03 MED ORDER — PROMETHAZINE HCL 25 MG/ML IJ SOLN
25.0000 mg | Freq: Four times a day (QID) | INTRAMUSCULAR | Status: DC | PRN
  Administered 2014-01-03 (×2): 25 mg via INTRAVENOUS
  Filled 2014-01-03 (×2): qty 1

## 2014-01-03 MED ORDER — TERBUTALINE SULFATE 1 MG/ML IJ SOLN: 0.2500 mg | Freq: Once | INTRAMUSCULAR | Status: AC | PRN

## 2014-01-03 MED ORDER — OXYTOCIN 40 UNITS IN LACTATED RINGERS INFUSION - SIMPLE MED: 62.5000 mL/h | INTRAVENOUS | Status: DC

## 2014-01-03 MED ORDER — NALBUPHINE HCL 10 MG/ML IJ SOLN
10.0000 mg | INTRAMUSCULAR | Status: DC | PRN
  Administered 2014-01-03 (×2): 10 mg via INTRAVENOUS
  Filled 2014-01-03 (×2): qty 1

## 2014-01-03 MED ORDER — DEXTROSE IN LACTATED RINGERS 5 % IV SOLN
INTRAVENOUS | Status: DC
  Administered 2014-01-03 – 2014-01-04 (×3): via INTRAVENOUS

## 2014-01-03 MED ORDER — LIDOCAINE HCL (PF) 1 % IJ SOLN
30.0000 mL | INTRAMUSCULAR | Status: AC | PRN
  Administered 2014-01-03 (×2): 5 mL via SUBCUTANEOUS

## 2014-01-03 MED ORDER — ONDANSETRON HCL 4 MG/2ML IJ SOLN: 4.0000 mg | Freq: Four times a day (QID) | INTRAMUSCULAR | Status: DC | PRN

## 2014-01-03 MED ORDER — LACTATED RINGERS IV SOLN: 500.0000 mL | INTRAVENOUS | Status: DC | PRN

## 2014-01-03 MED ORDER — LACTATED RINGERS IV SOLN
INTRAVENOUS | Status: DC
  Administered 2014-01-03 – 2014-01-04 (×5): via INTRAVENOUS

## 2014-01-03 MED ORDER — DIPHENHYDRAMINE HCL 50 MG/ML IJ SOLN
12.5000 mg | INTRAMUSCULAR | Status: DC | PRN
  Administered 2014-01-04: 12.5 mg via INTRAVENOUS
  Filled 2014-01-03: qty 1

## 2014-01-03 MED ORDER — FENTANYL 2.5 MCG/ML BUPIVACAINE 1/10 % EPIDURAL INFUSION (WH - ANES)
14.0000 mL/h | INTRAMUSCULAR | Status: DC | PRN
  Administered 2014-01-03 – 2014-01-04 (×2): 14 mL/h via EPIDURAL
  Filled 2014-01-03 (×2): qty 125

## 2014-01-03 MED ORDER — OXYTOCIN BOLUS FROM INFUSION: 500.0000 mL | INTRAVENOUS | Status: DC

## 2014-01-03 MED ORDER — EPHEDRINE 5 MG/ML INJ: 10.0000 mg | INTRAVENOUS | Status: DC | PRN

## 2014-01-03 NOTE — Progress Notes (Signed)
Rebecca Mann is a 23 y.o. G2P0101 at [redacted]w[redacted]d.  Subjective: Patient feeling more contractions but states they're still pretty spread apart.  Objective: BP 102/57  Pulse 76  Temp(Src) 98.1 F (36.7 C) (Oral)  Resp 20  Ht 5' (1.524 m)  Wt 72.213 kg (159 lb 3.2 oz)  BMI 31.09 kg/m2  SpO2 100%  LMP 04/10/2013   FHT:  FHR: 130 bpm, variability: mod,  accelerations:  present,  decelerations:  none UC:   Q 10 minutes Dilation: 1 Effacement (%): Thick Cervical Position: Posterior Station: Ballotable Presentation: Vertex Exam by:: D. Poe CNM RN strongly pulled on foley bulb with no movement  Labs: Results for orders placed during the hospital encounter of 01/02/14 (from the past 24 hour(s))  POCT FERN TEST     Status: None   Collection Time    01/02/14 11:47 PM      Result Value Ref Range   POCT Fern Test Positive = ruptured amniotic membanes    CBC     Status: Abnormal   Collection Time    01/03/14 12:00 AM      Result Value Ref Range   WBC 7.4  4.0 - 10.5 K/uL   RBC 5.86 (*) 3.87 - 5.11 MIL/uL   Hemoglobin 13.2  12.0 - 15.0 g/dL   HCT 27.0  62.3 - 76.2 %   MCV 66.9 (*) 78.0 - 100.0 fL   MCH 22.5 (*) 26.0 - 34.0 pg   MCHC 33.7  30.0 - 36.0 g/dL   RDW 83.1  51.7 - 61.6 %   Platelets 177  150 - 400 K/uL  RPR     Status: None   Collection Time    01/03/14 12:00 AM      Result Value Ref Range   RPR NON REAC  NON REAC  GLUCOSE, CAPILLARY     Status: Abnormal   Collection Time    01/03/14 12:16 AM      Result Value Ref Range   Glucose-Capillary 107 (*) 70 - 99 mg/dL  TYPE AND SCREEN     Status: None   Collection Time    01/03/14 12:37 AM      Result Value Ref Range   ABO/RH(D) A POS     Antibody Screen NEG     Sample Expiration 01/06/2014    GLUCOSE, CAPILLARY     Status: Abnormal   Collection Time    01/03/14  6:17 AM      Result Value Ref Range   Glucose-Capillary 67 (*) 70 - 99 mg/dL  GLUCOSE, CAPILLARY     Status: Abnormal   Collection Time    01/03/14   6:49 AM      Result Value Ref Range   Glucose-Capillary 65 (*) 70 - 99 mg/dL  GLUCOSE, CAPILLARY     Status: Abnormal   Collection Time    01/03/14  8:08 AM      Result Value Ref Range   Glucose-Capillary 175 (*) 70 - 99 mg/dL   Comment 1 Documented in Chart    GLUCOSE, CAPILLARY     Status: Abnormal   Collection Time    01/03/14 11:08 AM      Result Value Ref Range   Glucose-Capillary 222 (*) 70 - 99 mg/dL  GLUCOSE, CAPILLARY     Status: Abnormal   Collection Time    01/03/14 12:08 PM      Result Value Ref Range   Glucose-Capillary 197 (*) 70 - 99 mg/dL  GLUCOSE, CAPILLARY  Status: Abnormal   Collection Time    01/03/14  1:17 PM      Result Value Ref Range   Glucose-Capillary 154 (*) 70 - 99 mg/dL  GLUCOSE, CAPILLARY     Status: None   Collection Time    01/03/14  2:17 PM      Result Value Ref Range   Glucose-Capillary 92  70 - 99 mg/dL  GLUCOSE, CAPILLARY     Status: None   Collection Time    01/03/14  3:08 PM      Result Value Ref Range   Glucose-Capillary 86  70 - 99 mg/dL    Assessment / Plan: [redacted]w[redacted]d week IUP Labor: Not progressing with FB in place for >5 hours. As we cannot use Cytotec given previous C-section, will use pit 2 milli-units/min for cervical ripening.  Fetal Wellbeing:  Category 1 Pain Control:  Nubain PRN Anticipated MOD:  TOLAC, patient very thirsty; currently NPO due to concerns for c-section. Will allow her to have sips of water and discuss with new attending at 5pm.  Joanna Puff, MD [facility] Family Medicine Resident

## 2014-01-03 NOTE — Progress Notes (Signed)
Rebecca Mann is a 23 y.o. G2P0101 at [redacted]w[redacted]d   Subjective: Now comfortable with epidural  Objective: BP 106/69  Pulse 64  Temp(Src) 97.8 F (36.6 C) (Oral)  Resp 18  Ht 5' (1.524 m)  Wt 72.213 kg (159 lb 3.2 oz)  BMI 31.09 kg/m2  SpO2 100%  LMP 04/10/2013      FHT:  FHR: 130s bpm, variability: moderate,  accelerations:  Present,  decelerations:  Absent UC:   irregular, every 2-5 minutes with Pitocin @ 65mu/min steady SVE:   Dilation: 2 Effacement (%): Thick Station: Ballotable Exam by:: D. Poe CNM- more like 1-2cm/thick/high/ballot  Foley bulb placed  Labs: Lab Results  Component Value Date   WBC 7.4 01/03/2014   HGB 13.2 01/03/2014   HCT 39.2 01/03/2014   MCV 66.9* 01/03/2014   PLT 177 01/03/2014   CBGs 100-123 this evening with insulin gtt   Assessment / Plan: IOL process TOLAC Type I DM PROM x 22hrs  Will leave foley bulb in place until it comes out Continue with Pitocin @ a steady 2 mu/min; will increase once foley out Can have diet clears for now  Cam Hai CNM 01/03/2014, 9:18 PM

## 2014-01-03 NOTE — Progress Notes (Signed)
Diabetic Coordinator notified of patient, state she will be in to see patient tomorrow 01/04/14

## 2014-01-03 NOTE — H&P (Signed)
Rebecca Mann is a 23 y.o. G2P0101 at [facility] of 38.2 by LMP presenting for leakage of fluid. She reports a large of amount of leakage of fluid around 1030 this evening. She is having contractions. Denies vaginal bleeding. Reports good fetal movement. No fevers or vaginal discharge. She is GBS positive. Pregnancy is complicated by prior c-section at GA [redacted] weeks due to preeclampsia. She is on a baby aspirin for prevention. She desires a TOLAC and has signed her consent. This baby is female gender and desires circumcision to performed in the outpatient setting. She intends to breast and bottle feed. She is undecided regarding contraception.  Pt also has type I DM (Class B) and takes insulin for this. She had a normal fetal echo performed this pregnancy. Reports compliance with insulin regimen and last dose was this evening prior to coming into [facility]. She has taken all of her insulin doses for today.  Maternal Medical History:  Reason for admission: Rupture of membranes.  Nausea.    OB History   Grav Para Term Preterm Abortions TAB SAB Ect Mult Living   2 1  1      1      Past Medical History  Diagnosis Date  . Pregnancy induced hypertension   . Diabetes mellitus without complication   . Rheumatoid arthritis   . Preterm labor    Past Surgical History  Procedure Laterality Date  . Cesarean section     Family History: family history includes Diabetes in her brother, maternal grandfather, maternal grandmother, and mother; Hypertension in her mother. Social History:  reports that she quit smoking about 2 years ago. She has never used smokeless tobacco. She reports that she does not drink alcohol or use illicit drugs.   Review of Systems  Constitutional: Negative for fever and chills.  Gastrointestinal: Negative for nausea, vomiting and abdominal pain.  Genitourinary: Negative for dysuria.    Dilation: Fingertip Effacement (%): 50 Station: Ballotable Exam by:: D Simpson RN Blood pressure  120/81, pulse 75, temperature 98.2 F (36.8 C), temperature source Oral, resp. rate 18, height 5' (1.524 m), weight 72.213 kg (159 lb 3.2 oz), last menstrual period 04/10/2013, SpO2 100.00%. Maternal Exam:  Uterine Assessment: Contraction frequency is regular.   Abdomen: Surgical scars: low transverse.   Fetal presentation: vertex  Introitus: Ferning test: positive.  Amniotic fluid character: clear.     Fetal Exam Fetal Monitor Review: Baseline rate: 140.  Variability: moderate (6-25 bpm).   Pattern: accelerations present and no decelerations.    Fetal State Assessment: Category I - tracings are normal.     Physical Exam  Constitutional: She is oriented to person, place, and time. She appears well-developed. No distress.  Eyes: Conjunctivae and EOM are normal.  Neck: Normal range of motion.  Cardiovascular: Normal rate, regular rhythm and normal heart sounds.   Respiratory: Effort normal and breath sounds normal. No respiratory distress.  GI: Soft. Bowel sounds are normal. There is no tenderness.  Musculoskeletal: Normal range of motion. She exhibits no edema.  Neurological: She is oriented to person, place, and time. She has normal reflexes. She exhibits normal muscle tone.    Prenatal labs: ABO, Rh: --/--/A POS (08/04 1650) Antibody: NEG (08/04 1650) Rubella: Immune (05/11 0000) RPR: NON REAC (06/29 1148)  HBsAg:   Nonreactive HIV: NONREACTIVE (06/29 1148)  GBS: Positive (09/03 0000)    Prenatal Transfer Tool  Maternal Diabetes: Yes:  Diabetes Type:  Pre-pregnancy, Insulin/Medication controlled Genetic Screening: Prenatal records obtained from [facility] did  not include genetic screen and presented too late Maternal Ultrasounds/Referrals: Normal Fetal Ultrasounds or other Referrals:  Fetal echo Maternal Substance Abuse:  No Significant Maternal Medications:  Meds include: Other:  insulin and baby aspirin Significant Maternal Lab Results:  None Other Comments:   None  Assessment/Plan: Rebecca Mann is a 23 y.o. G2P0101 at [facility] of 38.2 by LMP presenting for leakage of fluid and will be admitted for PROM.  1. IUP at 38.2  -Will admit to L&D for expectant management due to PROM. -She is having a regular contraction pattern at this time. -Will reassess cervical exam 6 -8 hours after reported time of ROM to assess the need for active management. -Pt desires a TOLAC. Consent obtained.   2. FWB. Category I tracing. Will require continuous monitoring given TOLAC.  3. GBS positive. Start PCN for prophylaxis given PROM.  4. Class B DM, Type I. Pt has already taken insulin for today. -will hold insulin for now. -Start CBG monitoring q4hours and reassess need for insulin dosing.  5. History of preeclampsia. No signs or symptoms of toxicity currently.  -Will continue serial monitoring with exam and BPs.  6. MOF: breast and bottle.  7. Pain. Will need to address with pain when she becomes more uncomfortable.  8. Circumcision. Desires to be performed in the outpatient setting.  9. MOC: Undecided.  Pt was discussed with Dr. Bonney Aid, MD, PGY3 01/03/2014, 12:28 AM  I have seen the patient with the resident/student and agree with the above.  Tawnya Crook

## 2014-01-03 NOTE — Plan of Care (Signed)
Problem: Consults Goal: Diabetes Guidelines per MD order/protocol Outcome: Progressing Patient started on glucose stabilizer  Patient instructed about new plan of care and blood sugars every hour.

## 2014-01-03 NOTE — Anesthesia Procedure Notes (Signed)
Epidural Patient location during procedure: OB  Staffing Anesthesiologist: Davaris Youtsey, CHRIS Performed by: anesthesiologist   Preanesthetic Checklist Completed: patient identified, surgical consent, pre-op evaluation, timeout performed, IV checked, risks and benefits discussed and monitors and equipment checked  Epidural Patient position: sitting Prep: site prepped and draped and DuraPrep Patient monitoring: heart rate, cardiac monitor, continuous pulse ox and blood pressure Approach: midline Location: L4-L5 Injection technique: LOR saline  Needle:  Needle type: Tuohy  Needle gauge: 17 G Needle length: 9 cm Needle insertion depth: 5 cm Catheter type: closed end flexible Catheter size: 19 Gauge Catheter at skin depth: 12 cm Test dose: Other  Assessment Events: blood not aspirated, injection not painful, no injection resistance, negative IV test and no paresthesia  Additional Notes H+P and labs checked, risks and benefits discussed with the patient, consent obtained, procedure tolerated well and without complications.  Reason for block:procedure for pain   

## 2014-01-03 NOTE — Anesthesia Preprocedure Evaluation (Signed)
Anesthesia Evaluation  Patient identified by MRN, date of birth, ID band Patient awake    Reviewed: Allergy & Precautions, H&P , NPO status , Patient's Chart, lab work & pertinent test results  Airway Mallampati: II TM Distance: >3 FB Neck ROM: Full    Dental  (+) Teeth Intact   Pulmonary neg shortness of breath, neg sleep apnea, neg COPDneg recent URI, former smoker,          Cardiovascular negative cardio ROS  Rhythm:Regular     Neuro/Psych negative neurological ROS  negative psych ROS   GI/Hepatic negative GI ROS, Neg liver ROS,   Endo/Other  diabetes, Type 1, Insulin Dependent  Renal/GU negative Renal ROS     Musculoskeletal   Abdominal   Peds  Hematology negative hematology ROS (+)   Anesthesia Other Findings Previous scheduled C section for Pre E, Plan is TOLAC with this pregnancy. No issues with HTN this pregnancy. On insulin infusion for Type I DM  Reproductive/Obstetrics (+) Pregnancy                           Anesthesia Physical Anesthesia Plan  ASA: II  Anesthesia Plan: Epidural   Post-op Pain Management:    Induction:   Airway Management Planned:   Additional Equipment:   Intra-op Plan:   Post-operative Plan:   Informed Consent: I have reviewed the patients History and Physical, chart, labs and discussed the procedure including the risks, benefits and alternatives for the proposed anesthesia with the patient or authorized representative who has indicated his/her understanding and acceptance.     Plan Discussed with: Anesthesiologist  Anesthesia Plan Comments:         Anesthesia Quick Evaluation

## 2014-01-03 NOTE — Progress Notes (Signed)
Patient ID: Rebecca Mann, female   DOB: Jan 25, 1991, 23 y.o.   MRN: [id number] Rebecca Mann is a 23 y.o. G2P0101 at [redacted]w[redacted]d admitted for IOL indicated by PROM x 12 hr and ClassB DM. Desires TOLAC  Subjective: Comfortable. Leaking clear fluid.   Objective: BP 92/60  Pulse 70  Temp(Src) 98.4 F (36.9 C) (Oral)  Resp 20  Ht 5' (1.524 m)  Wt 72.213 kg (159 lb 3.2 oz)  BMI 31.09 kg/m2  SpO2 100%  LMP 04/10/2013  Fetal Heart FHR: 145 bpm, variability: moderate,  accelerations:  Present,  decelerations:  Absent   Contractions: occasional mild  SVE:   Dilation: 1 Effacement (%): Thick Station: Ballotable Exam by:: Lucas Mallow, RN 1030: posterior, FT/thick/-3. FB placed. Large amount clear AF Results for orders placed during the hospital encounter of 01/02/14 (from the past 24 hour(s))  POCT FERN TEST     Status: None   Collection Time    01/02/14 11:47 PM      Result Value Ref Range   POCT Fern Test Positive = ruptured amniotic membanes    CBC     Status: Abnormal   Collection Time    01/03/14 12:00 AM      Result Value Ref Range   WBC 7.4  4.0 - 10.5 K/uL   RBC 5.86 (*) 3.87 - 5.11 MIL/uL   Hemoglobin 13.2  12.0 - 15.0 g/dL   HCT 38.8  82.8 - 00.3 %   MCV 66.9 (*) 78.0 - 100.0 fL   MCH 22.5 (*) 26.0 - 34.0 pg   MCHC 33.7  30.0 - 36.0 g/dL   RDW 49.1  79.1 - 50.5 %   Platelets 177  150 - 400 K/uL  GLUCOSE, CAPILLARY     Status: Abnormal   Collection Time    01/03/14 12:16 AM      Result Value Ref Range   Glucose-Capillary 107 (*) 70 - 99 mg/dL  TYPE AND SCREEN     Status: None   Collection Time    01/03/14 12:37 AM      Result Value Ref Range   ABO/RH(D) A POS     Antibody Screen NEG     Sample Expiration 01/06/2014    GLUCOSE, CAPILLARY     Status: Abnormal   Collection Time    01/03/14  6:17 AM      Result Value Ref Range   Glucose-Capillary 67 (*) 70 - 99 mg/dL  GLUCOSE, CAPILLARY     Status: Abnormal   Collection Time    01/03/14  6:49 AM      Result Value  Ref Range   Glucose-Capillary 65 (*) 70 - 99 mg/dL  GLUCOSE, CAPILLARY     Status: Abnormal   Collection Time    01/03/14  8:08 AM      Result Value Ref Range   Glucose-Capillary 175 (*) 70 - 99 mg/dL   Comment 1 Documented in Chart      Assessment / Plan: B DM> start glucostabilizer. NPO. Consulted Dr. Penne Lash GBS carriage> IV PCN Labor: Begin cervical ripening Fetal Wellbeing: Category1 Pain Control:  N/a. Plans epidural when in labor Expected mode of delivery: TOLAC, hopeful for NSVD  Christina Gintz 01/03/2014, 10:36 AM

## 2014-01-03 NOTE — Progress Notes (Signed)
Patient ID: Rebecca Mann, female   DOB: 1991-02-05, 23 y.o.   MRN: [id number] Rebecca Mann is a 23 y.o. G2P0101 at [redacted]w[redacted]d admitted for IOL for PROM, pregestational  DM, TOLAC  Subjective: CTSP due to increased vaginal pain, writhing in bed and crying with UCs despite Nubain/Phenergan.   Objective: BP 119/72  Pulse 70  Temp(Src) 98.2 F (36.8 C) (Oral)  Resp 20  Ht 5' (1.524 m)  Wt 159 lb 3.2 oz (72.213 kg)  BMI 31.09 kg/m2  SpO2 100%  LMP 04/10/2013  Fetal Heart FHR: 130 bpm, variability: moderate,  accelerations:  Present,  decelerations:  Absent   Contractions: q 3 min. Pitocin at 2 mu UCs palpable, fundus NT  SVE:   Dilation: 1 Effacement (%): Thick Station: Ballotable Exam by:: Dr. Christy Gentles CNM VE: FB felt inside cx> removed FB; cx posterior soft 2/30/-3 vtx. Pain mostly relieved after removal  CBGs q hr from 12 N: [id number]@1816 .  Assessment / Plan:  Labor: cx unfavorable> will d/w Dr. Emelda Fear Fetal Wellbeing: Cat1 Pain Control:  assessing Expected mode of delivery: hopeful for VBAC Continue  low dose pit and NPO status  Kei Langhorst 01/03/2014, 6:33 PM

## 2014-01-03 NOTE — Progress Notes (Signed)
Rebecca Mann is a 23 y.o. G2P0101 at [redacted]w[redacted]d.  Subjective: Starting to feel more contractions. Sleepy from phenergan.  Objective: BP 127/79  Pulse 71  Temp(Src) 98.5 F (36.9 C) (Oral)  Resp 20  Ht 5' (1.524 m)  Wt 72.213 kg (159 lb 3.2 oz)  BMI 31.09 kg/m2  SpO2 100%  LMP 04/10/2013   FHT:  FHR: 130 bpm, variability: mod,  accelerations:  present,  decelerations:  none UC:   Q 5-7 minutes Dilation: 1 Effacement (%): Thick Cervical Position: Posterior Station: Ballotable Presentation: Vertex (confirmed by B/S U/S done by D. Poe CNM) Exam by:: Dr. Christy Gentles CNM  Labs: Results for orders placed during the hospital encounter of 01/02/14 (from the past 24 hour(s))  POCT FERN TEST     Status: None   Collection Time    01/02/14 11:47 PM      Result Value Ref Range   POCT Fern Test Positive = ruptured amniotic membanes    CBC     Status: Abnormal   Collection Time    01/03/14 12:00 AM      Result Value Ref Range   WBC 7.4  4.0 - 10.5 K/uL   RBC 5.86 (*) 3.87 - 5.11 MIL/uL   Hemoglobin 13.2  12.0 - 15.0 g/dL   HCT 51.7  61.6 - 07.3 %   MCV 66.9 (*) 78.0 - 100.0 fL   MCH 22.5 (*) 26.0 - 34.0 pg   MCHC 33.7  30.0 - 36.0 g/dL   RDW 71.0  62.6 - 94.8 %   Platelets 177  150 - 400 K/uL  RPR     Status: None   Collection Time    01/03/14 12:00 AM      Result Value Ref Range   RPR NON REAC  NON REAC  GLUCOSE, CAPILLARY     Status: Abnormal   Collection Time    01/03/14 12:16 AM      Result Value Ref Range   Glucose-Capillary 107 (*) 70 - 99 mg/dL  TYPE AND SCREEN     Status: None   Collection Time    01/03/14 12:37 AM      Result Value Ref Range   ABO/RH(D) A POS     Antibody Screen NEG     Sample Expiration 01/06/2014    GLUCOSE, CAPILLARY     Status: Abnormal   Collection Time    01/03/14  6:17 AM      Result Value Ref Range   Glucose-Capillary 67 (*) 70 - 99 mg/dL  GLUCOSE, CAPILLARY     Status: Abnormal   Collection Time    01/03/14  6:49 AM      Result Value Ref  Range   Glucose-Capillary 65 (*) 70 - 99 mg/dL  GLUCOSE, CAPILLARY     Status: Abnormal   Collection Time    01/03/14  8:08 AM      Result Value Ref Range   Glucose-Capillary 175 (*) 70 - 99 mg/dL   Comment 1 Documented in Chart    GLUCOSE, CAPILLARY     Status: Abnormal   Collection Time    01/03/14 11:08 AM      Result Value Ref Range   Glucose-Capillary 222 (*) 70 - 99 mg/dL  GLUCOSE, CAPILLARY     Status: Abnormal   Collection Time    01/03/14 12:08 PM      Result Value Ref Range   Glucose-Capillary 197 (*) 70 - 99 mg/dL  GLUCOSE, CAPILLARY  Status: Abnormal   Collection Time    01/03/14  1:17 PM      Result Value Ref Range   Glucose-Capillary 154 (*) 70 - 99 mg/dL  GLUCOSE, CAPILLARY     Status: None   Collection Time    01/03/14  2:17 PM      Result Value Ref Range   Glucose-Capillary 92  70 - 99 mg/dL  GLUCOSE, CAPILLARY     Status: None   Collection Time    01/03/14  3:08 PM      Result Value Ref Range   Glucose-Capillary 86  70 - 99 mg/dL  GLUCOSE, CAPILLARY     Status: None   Collection Time    01/03/14  3:57 PM      Result Value Ref Range   Glucose-Capillary 84  70 - 99 mg/dL  GLUCOSE, CAPILLARY     Status: Abnormal   Collection Time    01/03/14  5:06 PM      Result Value Ref Range   Glucose-Capillary 100 (*) 70 - 99 mg/dL    Assessment / Plan: [redacted]w[redacted]d week IUP Labor: Will add low dose pitocin for cervical rippening  Fetal Wellbeing:  Category 1 Pain Control:  nubain once, RN to advise if pt requires any more medication  Anticipated MOD:  NSVD  Joanna Puff, MD [facility] Family Medicine Resident

## 2014-01-03 NOTE — Progress Notes (Signed)
Went into patient room to assess BS per order; patient stated she was feeling shaking and symptomatic; CBG result 67. Gave patient 2 cups of apple juice and and a few crackers; patient requesting more at 20 minutes because she still felt shakey; rechecked CBG and it was 65. Dr. Karie Mainland on unit and made aware and instructed to give more juice at this time.

## 2014-01-04 ENCOUNTER — Inpatient Hospital Stay (HOSPITAL_COMMUNITY): Payer: Medicaid Other

## 2014-01-04 ENCOUNTER — Encounter (HOSPITAL_COMMUNITY)
Admission: AD | Disposition: A | Discharge status: Home / Self Care | Payer: Medicaid Other | Source: Ambulatory Visit | Attending: Obstetrics & Gynecology

## 2014-01-04 ENCOUNTER — Encounter (HOSPITAL_COMMUNITY): Payer: Self-pay | Admitting: Certified Registered Nurse Anesthetist

## 2014-01-04 ENCOUNTER — Encounter (HOSPITAL_COMMUNITY): Payer: Self-pay

## 2014-01-04 ENCOUNTER — Other Ambulatory Visit: Payer: Medicaid Other

## 2014-01-04 DIAGNOSIS — E119 Type 2 diabetes mellitus without complications: Secondary | ICD-10-CM

## 2014-01-04 DIAGNOSIS — O2432 Unspecified pre-existing diabetes mellitus in childbirth: Secondary | ICD-10-CM

## 2014-01-04 DIAGNOSIS — O34219 Maternal care for unspecified type scar from previous cesarean delivery: Secondary | ICD-10-CM

## 2014-01-04 LAB — CBC WITH DIFFERENTIAL/PLATELET
BASOS ABS: 0 10*3/uL (ref 0.0–0.1)
Basophils Relative: 0 % (ref 0–1)
EOS PCT: 0 % (ref 0–5)
Eosinophils Absolute: 0.1 10*3/uL (ref 0.0–0.7)
HEMATOCRIT: 29.6 % — AB (ref 36.0–46.0)
HEMOGLOBIN: 9.5 g/dL — AB (ref 12.0–15.0)
Lymphocytes Relative: 12 % (ref 12–46)
Lymphs Abs: 1.3 10*3/uL (ref 0.7–4.0)
MCH: 22 pg — ABNORMAL LOW (ref 26.0–34.0)
MCHC: 32.1 g/dL (ref 30.0–36.0)
MCV: 68.5 fL — AB (ref 78.0–100.0)
MONO ABS: 1 10*3/uL (ref 0.1–1.0)
Monocytes Relative: 9 % (ref 3–12)
Neutro Abs: 8.9 10*3/uL — ABNORMAL HIGH (ref 1.7–7.7)
Neutrophils Relative %: 79 % — ABNORMAL HIGH (ref 43–77)
Platelets: 164 10*3/uL (ref 150–400)
RBC: 4.32 MIL/uL (ref 3.87–5.11)
RDW: 14 % (ref 11.5–15.5)
WBC: 11.3 10*3/uL — AB (ref 4.0–10.5)

## 2014-01-04 LAB — GLUCOSE, CAPILLARY
GLUCOSE-CAPILLARY: 103 mg/dL — AB (ref 70–99)
GLUCOSE-CAPILLARY: 114 mg/dL — AB (ref 70–99)
GLUCOSE-CAPILLARY: 80 mg/dL (ref 70–99)
GLUCOSE-CAPILLARY: 99 mg/dL (ref 70–99)
GLUCOSE-CAPILLARY: 106 mg/dL — AB (ref 70–99)
GLUCOSE-CAPILLARY: 58 mg/dL — AB (ref 70–99)
GLUCOSE-CAPILLARY: 135 mg/dL — AB (ref 70–99)
GLUCOSE-CAPILLARY: 166 mg/dL — AB (ref 70–99)
Glucose-Capillary: 106 mg/dL — ABNORMAL HIGH (ref 70–99)
Glucose-Capillary: 175 mg/dL — ABNORMAL HIGH (ref 70–99)
Glucose-Capillary: 182 mg/dL — ABNORMAL HIGH (ref 70–99)
Glucose-Capillary: 197 mg/dL — ABNORMAL HIGH (ref 70–99)
Glucose-Capillary: 63 mg/dL — ABNORMAL LOW (ref 70–99)
Glucose-Capillary: 68 mg/dL — ABNORMAL LOW (ref 70–99)
Glucose-Capillary: 95 mg/dL (ref 70–99)

## 2014-01-04 LAB — COMPREHENSIVE METABOLIC PANEL
ALT: 11 U/L (ref 0–35)
ANION GAP: 8 (ref 5–15)
AST: 19 U/L (ref 0–37)
Albumin: 2.1 g/dL — ABNORMAL LOW (ref 3.5–5.2)
Alkaline Phosphatase: 172 U/L — ABNORMAL HIGH (ref 39–117)
BUN: 3 mg/dL — ABNORMAL LOW (ref 6–23)
CALCIUM: 8.4 mg/dL (ref 8.4–10.5)
CO2: 27 mEq/L (ref 19–32)
Chloride: 106 mEq/L (ref 96–112)
Creatinine, Ser: 0.68 mg/dL (ref 0.50–1.10)
GFR calc Af Amer: >90 mL/min (ref >90)
GLUCOSE: 135 mg/dL — AB (ref 70–99)
Potassium: 4 mEq/L (ref 3.7–5.3)
Sodium: 141 mEq/L (ref 137–147)
Total Bilirubin: 0.4 mg/dL (ref 0.3–1.2)
Total Protein: 4.6 g/dL — ABNORMAL LOW (ref 6.0–8.3)

## 2014-01-04 SURGERY — Surgical Case
Anesthesia: Epidural

## 2014-01-04 MED ORDER — MENTHOL 3 MG MT LOZG
1.0000 | LOZENGE | OROMUCOSAL | Status: DC | PRN
  Administered 2014-01-04: 3 mg via ORAL
  Filled 2014-01-04: qty 9

## 2014-01-04 MED ORDER — MEPERIDINE HCL 25 MG/ML IJ SOLN
6.2500 mg | INTRAMUSCULAR | Status: DC | PRN
  Administered 2014-01-04: 6.25 mg via INTRAVENOUS

## 2014-01-04 MED ORDER — WITCH HAZEL-GLYCERIN EX PADS: 1.0000 "application " | MEDICATED_PAD | CUTANEOUS | Status: DC | PRN

## 2014-01-04 MED ORDER — LACTATED RINGERS IV SOLN
INTRAVENOUS | Status: DC | PRN
  Administered 2014-01-04: 05:00:00 via INTRAVENOUS

## 2014-01-04 MED ORDER — INSULIN ASPART 100 UNIT/ML ~~LOC~~ SOLN
0.0000 [IU] | Freq: Three times a day (TID) | SUBCUTANEOUS | Status: DC
  Administered 2014-01-06: 1 [IU] via SUBCUTANEOUS

## 2014-01-04 MED ORDER — OXYCODONE HCL 5 MG/5ML PO SOLN: 5.0000 mg | Freq: Once | ORAL | Status: DC | PRN

## 2014-01-04 MED ORDER — MEPERIDINE HCL 25 MG/ML IJ SOLN
INTRAMUSCULAR | Status: AC
  Filled 2014-01-04: qty 1

## 2014-01-04 MED ORDER — OXYTOCIN 40 UNITS IN LACTATED RINGERS INFUSION - SIMPLE MED: 62.5000 mL/h | INTRAVENOUS | Status: AC

## 2014-01-04 MED ORDER — HYDROMORPHONE HCL 1 MG/ML IJ SOLN
0.5000 mg | Freq: Once | INTRAMUSCULAR | Status: AC | PRN
Start: 2014-01-04 — End: 2014-01-04
  Administered 2014-01-04: 0.5 mg via INTRAVENOUS
  Filled 2014-01-04 (×2): qty 1

## 2014-01-04 MED ORDER — PNEUMOCOCCAL VAC POLYVALENT 25 MCG/0.5ML IJ INJ
0.5000 mL | INJECTION | INTRAMUSCULAR | Status: AC
  Administered 2014-01-05: 0.5 mL via INTRAMUSCULAR
  Filled 2014-01-04: qty 0.5

## 2014-01-04 MED ORDER — SODIUM BICARBONATE 8.4 % IV SOLN
INTRAVENOUS | Status: DC | PRN
  Administered 2014-01-04 (×4): 5 mL via EPIDURAL

## 2014-01-04 MED ORDER — SIMETHICONE 80 MG PO CHEW
80.0000 mg | CHEWABLE_TABLET | ORAL | Status: DC
  Administered 2014-01-05 – 2014-01-06 (×4): 80 mg via ORAL
  Filled 2014-01-04 (×2): qty 1

## 2014-01-04 MED ORDER — SENNOSIDES-DOCUSATE SODIUM 8.6-50 MG PO TABS
2.0000 | ORAL_TABLET | ORAL | Status: DC
  Administered 2014-01-05 (×2): 2 via ORAL
  Filled 2014-01-04 (×2): qty 2

## 2014-01-04 MED ORDER — LACTATED RINGERS IV SOLN
40.0000 [IU] | INTRAVENOUS | Status: DC | PRN
  Administered 2014-01-04: 40 [IU] via INTRAVENOUS

## 2014-01-04 MED ORDER — LACTATED RINGERS IV SOLN
INTRAVENOUS | Status: DC
  Administered 2014-01-04 (×2): via INTRAVENOUS

## 2014-01-04 MED ORDER — PRENATAL MULTIVITAMIN CH
1.0000 | ORAL_TABLET | Freq: Every day | ORAL | Status: DC
  Administered 2014-01-04 – 2014-01-07 (×4): 1 via ORAL
  Filled 2014-01-04 (×4): qty 1

## 2014-01-04 MED ORDER — SODIUM BICARBONATE 8.4 % IV SOLN
INTRAVENOUS | Status: DC | PRN
  Administered 2014-01-04: 30 mL via EPIDURAL

## 2014-01-04 MED ORDER — INSULIN ASPART 100 UNIT/ML ~~LOC~~ SOLN: 0.0000 [IU] | Freq: Every day | SUBCUTANEOUS | Status: DC

## 2014-01-04 MED ORDER — INSULIN GLARGINE 100 UNIT/ML ~~LOC~~ SOLN
15.0000 [IU] | Freq: Every day | SUBCUTANEOUS | Status: DC
  Administered 2014-01-05 – 2014-01-06 (×2): 15 [IU] via SUBCUTANEOUS
  Filled 2014-01-04 (×5): qty 0.15

## 2014-01-04 MED ORDER — ACETAMINOPHEN 160 MG/5ML PO SOLN: 325.0000 mg | ORAL | Status: DC | PRN

## 2014-01-04 MED ORDER — MORPHINE SULFATE (PF) 0.5 MG/ML IJ SOLN
INTRAMUSCULAR | Status: DC | PRN
  Administered 2014-01-04: 4 mg via EPIDURAL

## 2014-01-04 MED ORDER — CEFAZOLIN SODIUM-DEXTROSE 2-3 GM-% IV SOLR
INTRAVENOUS | Status: DC | PRN
  Administered 2014-01-04: 2 g via INTRAVENOUS

## 2014-01-04 MED ORDER — SIMETHICONE 80 MG PO CHEW
80.0000 mg | CHEWABLE_TABLET | ORAL | Status: DC | PRN
  Filled 2014-01-04: qty 1

## 2014-01-04 MED ORDER — MIDAZOLAM HCL 2 MG/2ML IJ SOLN
INTRAMUSCULAR | Status: AC
  Filled 2014-01-04: qty 2

## 2014-01-04 MED ORDER — MORPHINE SULFATE (PF) 0.5 MG/ML IJ SOLN
INTRAMUSCULAR | Status: DC | PRN
  Administered 2014-01-04: .5 mg via INTRAVENOUS

## 2014-01-04 MED ORDER — ZOLPIDEM TARTRATE 5 MG PO TABS: 5.0000 mg | ORAL_TABLET | Freq: Every evening | ORAL | Status: DC | PRN

## 2014-01-04 MED ORDER — OXYCODONE-ACETAMINOPHEN 5-325 MG PO TABS
2.0000 | ORAL_TABLET | ORAL | Status: DC | PRN
  Administered 2014-01-04 – 2014-01-07 (×12): 2 via ORAL
  Filled 2014-01-04 (×12): qty 2

## 2014-01-04 MED ORDER — MIDAZOLAM HCL 2 MG/2ML IJ SOLN
INTRAMUSCULAR | Status: DC | PRN
  Administered 2014-01-04: 2 mg via INTRAVENOUS

## 2014-01-04 MED ORDER — TETANUS-DIPHTH-ACELL PERTUSSIS 5-2.5-18.5 LF-MCG/0.5 IM SUSP
0.5000 mL | Freq: Once | INTRAMUSCULAR | Status: DC
  Filled 2014-01-04: qty 0.5

## 2014-01-04 MED ORDER — INSULIN GLARGINE 100 UNIT/ML ~~LOC~~ SOLN
20.0000 [IU] | Freq: Every day | SUBCUTANEOUS | Status: DC
  Administered 2014-01-04: 20 [IU] via SUBCUTANEOUS
  Filled 2014-01-04 (×2): qty 0.2

## 2014-01-04 MED ORDER — CHLOROPROCAINE HCL 3 % IJ SOLN
INTRAMUSCULAR | Status: AC
  Filled 2014-01-04: qty 20

## 2014-01-04 MED ORDER — HYDROMORPHONE HCL 1 MG/ML IJ SOLN
INTRAMUSCULAR | Status: AC
  Filled 2014-01-04: qty 1

## 2014-01-04 MED ORDER — PHENYLEPHRINE 8 MG IN D5W 100 ML (0.08MG/ML) PREMIX OPTIME
INJECTION | INTRAVENOUS | Status: DC | PRN
  Administered 2014-01-04: 40 ug/min via INTRAVENOUS

## 2014-01-04 MED ORDER — ONDANSETRON HCL 4 MG/2ML IJ SOLN
INTRAMUSCULAR | Status: DC | PRN
  Administered 2014-01-04: 4 mg via INTRAVENOUS

## 2014-01-04 MED ORDER — ONDANSETRON HCL 4 MG PO TABS: 4.0000 mg | ORAL_TABLET | ORAL | Status: DC | PRN

## 2014-01-04 MED ORDER — ONDANSETRON HCL 4 MG/2ML IJ SOLN: 4.0000 mg | INTRAMUSCULAR | Status: DC | PRN

## 2014-01-04 MED ORDER — ACETAMINOPHEN 325 MG PO TABS: 325.0000 mg | ORAL_TABLET | ORAL | Status: DC | PRN

## 2014-01-04 MED ORDER — LANOLIN HYDROUS EX OINT: 1.0000 "application " | TOPICAL_OINTMENT | CUTANEOUS | Status: DC | PRN

## 2014-01-04 MED ORDER — DIBUCAINE 1 % RE OINT: 1.0000 "application " | TOPICAL_OINTMENT | RECTAL | Status: DC | PRN

## 2014-01-04 MED ORDER — MORPHINE SULFATE (PF) 0.5 MG/ML IJ SOLN: INTRAMUSCULAR | Status: DC | PRN

## 2014-01-04 MED ORDER — LIDOCAINE HCL 1 % IJ SOLN
INTRAMUSCULAR | Status: AC
  Filled 2014-01-04: qty 20

## 2014-01-04 MED ORDER — NALBUPHINE HCL 10 MG/ML IJ SOLN
2.5000 mg | INTRAMUSCULAR | Status: DC | PRN
  Administered 2014-01-04 (×2): 2.5 mg via INTRAVENOUS
  Filled 2014-01-04 (×2): qty 1

## 2014-01-04 MED ORDER — OXYCODONE HCL 5 MG PO TABS: 5.0000 mg | ORAL_TABLET | Freq: Once | ORAL | Status: DC | PRN

## 2014-01-04 MED ORDER — DIPHENHYDRAMINE HCL 25 MG PO CAPS
25.0000 mg | ORAL_CAPSULE | Freq: Four times a day (QID) | ORAL | Status: DC | PRN
  Administered 2014-01-04: 25 mg via ORAL
  Filled 2014-01-04: qty 1

## 2014-01-04 MED ORDER — IBUPROFEN 600 MG PO TABS
600.0000 mg | ORAL_TABLET | Freq: Four times a day (QID) | ORAL | Status: DC
  Administered 2014-01-04 – 2014-01-07 (×13): 600 mg via ORAL
  Filled 2014-01-04 (×13): qty 1

## 2014-01-04 MED ORDER — MORPHINE SULFATE 0.5 MG/ML IJ SOLN
INTRAMUSCULAR | Status: AC
  Filled 2014-01-04: qty 10

## 2014-01-04 MED ORDER — HYDROMORPHONE HCL 1 MG/ML IJ SOLN
0.5000 mg | INTRAMUSCULAR | Status: AC
  Administered 2014-01-04: 0.5 mg via INTRAVENOUS

## 2014-01-04 MED ORDER — OXYCODONE-ACETAMINOPHEN 5-325 MG PO TABS
1.0000 | ORAL_TABLET | ORAL | Status: DC | PRN
  Administered 2014-01-05: 1 via ORAL
  Filled 2014-01-04: qty 1

## 2014-01-04 MED ORDER — HYDROMORPHONE HCL 1 MG/ML IJ SOLN
0.2500 mg | INTRAMUSCULAR | Status: DC | PRN
  Administered 2014-01-04 (×2): 0.5 mg via INTRAVENOUS

## 2014-01-04 MED ORDER — SIMETHICONE 80 MG PO CHEW
80.0000 mg | CHEWABLE_TABLET | Freq: Three times a day (TID) | ORAL | Status: DC
  Administered 2014-01-04 – 2014-01-07 (×9): 80 mg via ORAL
  Filled 2014-01-04 (×10): qty 1

## 2014-01-04 SURGICAL SUPPLY — 38 items
BARRIER ADHS 3X4 INTERCEED (GAUZE/BANDAGES/DRESSINGS) ×3 IMPLANT
BENZOIN TINCTURE PRP APPL 2/3 (GAUZE/BANDAGES/DRESSINGS) ×3 IMPLANT
BLADE SURG 10 STRL SS (BLADE) ×6 IMPLANT
CLAMP CORD UMBIL (MISCELLANEOUS) ×3 IMPLANT
CLOSURE WOUND 1/2 X4 (GAUZE/BANDAGES/DRESSINGS) ×1
CLOSURE WOUND 1/4X4 (GAUZE/BANDAGES/DRESSINGS) ×1
CLOTH BEACON ORANGE TIMEOUT ST (SAFETY) ×3 IMPLANT
DRAPE LG THREE QUARTER DISP (DRAPES) ×3 IMPLANT
DRSG OPSITE POSTOP 4X10 (GAUZE/BANDAGES/DRESSINGS) ×3 IMPLANT
DURAPREP 26ML APPLICATOR (WOUND CARE) ×3 IMPLANT
ELECT REM PT RETURN 9FT ADLT (ELECTROSURGICAL) ×3
ELECTRODE REM PT RTRN 9FT ADLT (ELECTROSURGICAL) ×1 IMPLANT
EXTRACTOR VACUUM KIWI (MISCELLANEOUS) IMPLANT
GLOVE BIO SURGEON ST LM GN SZ9 (GLOVE) ×6 IMPLANT
GLOVE BIOGEL PI IND STRL 9 (GLOVE) ×1 IMPLANT
GLOVE BIOGEL PI INDICATOR 9 (GLOVE) ×2
GOWN STRL REUS W/TWL 2XL LVL3 (GOWN DISPOSABLE) ×3 IMPLANT
GOWN STRL REUS W/TWL LRG LVL3 (GOWN DISPOSABLE) ×3 IMPLANT
NEEDLE HYPO 25X5/8 SAFETYGLIDE (NEEDLE) ×3 IMPLANT
NS IRRIG 1000ML POUR BTL (IV SOLUTION) ×3 IMPLANT
PACK C SECTION WH (CUSTOM PROCEDURE TRAY) ×3 IMPLANT
PAD OB MATERNITY 4.3X12.25 (PERSONAL CARE ITEMS) ×3 IMPLANT
RTRCTR C-SECT PINK 25CM LRG (MISCELLANEOUS) ×3 IMPLANT
RTRCTR C-SECT PINK 34CM XLRG (MISCELLANEOUS) IMPLANT
SPONGE LAP 18X18 X RAY DECT (DISPOSABLE) ×6 IMPLANT
STRIP CLOSURE SKIN 1/2X4 (GAUZE/BANDAGES/DRESSINGS) ×2 IMPLANT
STRIP CLOSURE SKIN 1/4X4 (GAUZE/BANDAGES/DRESSINGS) ×2 IMPLANT
SUT MNCRL 0 VIOLET CTX 36 (SUTURE) ×4 IMPLANT
SUT MONOCRYL 0 CTX 36 (SUTURE) ×8
SUT VIC AB 0 CT1 27 (SUTURE) ×4
SUT VIC AB 0 CT1 27XBRD ANBCTR (SUTURE) ×2 IMPLANT
SUT VIC AB 2-0 CT1 27 (SUTURE) ×6
SUT VIC AB 2-0 CT1 TAPERPNT 27 (SUTURE) ×3 IMPLANT
SUT VIC AB 4-0 KS 27 (SUTURE) ×3 IMPLANT
SYR BULB IRRIGATION 50ML (SYRINGE) IMPLANT
TOWEL OR 17X24 6PK STRL BLUE (TOWEL DISPOSABLE) ×3 IMPLANT
TRAY FOLEY CATH 14FR (SET/KITS/TRAYS/PACK) IMPLANT
WATER STERILE IRR 1000ML POUR (IV SOLUTION) IMPLANT

## 2014-01-04 NOTE — Progress Notes (Signed)
Inpatient Diabetes Program Recommendations  AACE/ADA: New Consensus Statement on Inpatient Glycemic Control (2013)  Target Ranges:  Prepandial:   less than 140 mg/dL      Peak postprandial:   less than 180 mg/dL (1-2 hours)      Critically ill patients:  140 - 180 mg/dL   Results for Rebecca Mann, Rebecca Mann (MRN [id number]) as of 01/04/2014 16:22  Ref. Range 01/04/2014 10:06 01/04/2014 11:13 01/04/2014 13:04 01/04/2014 15:10 01/04/2014 15:31 01/04/2014 15:52  Glucose-Capillary Latest Range: 70-99 mg/dL 372 (H) 80 99 63 (L) 58 (L) 106 (H)    Outpatient Diabetes medications: NPH 16 units BID, Humalog 14 units TID with meals Current orders for Inpatient glycemic control: Lantus 20 units daily, Novolog 0-9 units AC  Inpatient Diabetes Program Recommendations Insulin - Basal: Please consider decreasing Lantus to 15 units daily.  Note: Patient is awake and sitting up in bed eating meal at this time. Talked with patient regarding diabetes and diabetes control over the past 2-4 weeks. Patient reports that she was taking NPH 16 units BID and Humalog 14 units TID with meals for diabetes control.  According to the patient she was having low blood sugars "about 3 times per week" over the past few weeks. Inquired about prepregnancy insulin regimen and patient states that she was taking Lantus and Humalog but she does not remember doses of either type of insulin. Patient moved from [place] to [place] in April or May of this year. While living in [place] she was seeing Dr. Edwyna Perfect [phone]) in [place], [place] for diabetes management. Called Dr. Hayden Rasmussen office in [place] to request prepregnancy insulin dosages and talked with Arlina Robes, RN in Dr. Hayden Rasmussen office. Nichole, RN reports that patient was last saw August 2014 and she was prescribed Lantus 40 units QHS, Humalog 0-20 units TID (using 1:8 grams for carb coverage + (BG - 100)/25 which is 1 unit brings glucose down 25 mg/dl). Nichole, RN also states that patient had  reported some lows with that regimen and was suppose to follow up with them but has not been seen there in over a year now.   Have talked with Okey Regal, RN several times throughout the day and she reports that the patient did not have any carbohydrates until around 11:20 when she had apple juice and graham crackers.  Patient had her first food tray around noon however it was just broth and apple juice. Patient's first solid food tray was received at 15:50.  Due to the lows patient has experienced today, please consider decreasing Lantus to 15 units daily. Once CBG is stabilized will likely need to add meal coverage. Will continue to follow and make recommendations as more data is collected.  Thanks, Orlando Penner, RN, MSN, CCRN Diabetes Coordinator Inpatient Diabetes Program [phone] (Team Pager) [phone] ([place] office) [phone] [facility] office)

## 2014-01-04 NOTE — Op Note (Signed)
6:55 AM  PATIENT:  Rebecca Mann  23 y.o. female  PRE-OPERATIVE DIAGNOSIS:  Cephalopelvic disproportion, type I Diabetes, pregnancy 38wk 3 days  POST-OPERATIVE DIAGNOSIS:  Cephalopelvic disproportion with hypertensive crisis, seizure-like activity versus hypoglycemic reaction, type I diabetes, pelvic adhesions, extensive.  PROCEDURE:  Procedure(s): CESAREAN SECTION (N/A) repeat low transverse.   SURGEON:  Surgeon(s) and Role:    * Tilda Burrow, MD - Primary  PHYSICIAN ASSISTANT:   ASSISTANTS: none   ANESTHESIA:   local and epidural  EBL:  Total I/O In: 1800 [I.V.:1800] Out: 4550 [Urine:2950; Blood:1600]  BLOOD ADMINISTERED:none  DRAINS: Urinary Catheter (Foley)   LOCAL MEDICATIONS USED:  OTHER nesa  SPECIMEN:  Source of Specimen:  Placenta to labor and delivery  DISPOSITION OF SPECIMEN:  Labor and delivery  COUNTS:  YES  TOURNIQUET:  * No tourniquets in log *  DICTATION: .Dragon Dictation  PLAN OF CARE: Has admission orders already  PATIENT DISPOSITION:  PACU - hemodynamically stable.   Delay start of Pharmacological VTE agent (>24hrs) due to surgical blood loss or risk of bleeding: not applicable Indications: Arrest of labor 5 cm long cervix -3 station with presenting part not in pelvis after augmentation of labor with blood sugars managed by glucose stabilizer. Last 3 AM blood sugar at 68 for which patient received oral juices remained asymptomatic.  Details of procedure: Patient was taken to the operating room, epidural already in place. The patient was in room 1, abdomen prepped and draped with Foley catheter in place timeout was conducted, Ancef administered 2 g intravenously and analgesic levels checked. The patient had complaints of some discomfort on the right side so there was a delay as we waited for analgesic effect the patient then developed a significant headache concurrent with the development of severe  hypertension to approximate to 220 systolic.  Neo-Synephrine infusion to prevent epidural related hypotension was quickly discontinued. Blood pressures recovered quickly to normal also than 15 minutes. Patient was noted to have altered mentation, and then became unresponsive with jerking of her upper. Extremities, rapid response team was called and cesarean section progressed rapidly. The patient had a extensive adhesions in the anterior abdominal wall and we could not identify cleavage plane between anterior peritoneum and the uterus. An extra peritoneal approach to the uterus was developed. With the bladder retracted inferiorly, and lower uterine segment identified and found to be deviated to the patient's right wrist incision could be made and extended laterally shortly to the vertex and with the head delivered and. Some blood just below the anterior edge of the anterior fundal placenta suggestive of small abruption and there was some blood in the amniotic fluid. The baby was delivered promptly was depressed and quickly passed to the pediatrician for care. See their notes for details. Apgars 1 and 8 were assigned and baby went to regular nursery. Placenta delivered easily. Membranes were removed, and uterus swabbed out. There was an inferior tension of the transverse incision on the right side approximately 4 cm in depth which was repaired as a separate closure with running locking 0 Monocryl the uterine incision was then closed transversely with a 2 layer closure there was a wide area of oozing just above the uterine incision, as had been some dissection into the myometrium during the initial efforts to find the peritoneal opening, and this required an opening of the inferior edge of the vesicouterine peritoneal reflection, which was quite high and allowed an additional third layer of closure incorporating the uterine peritoneal surface  to the upper aspects of the uterine cesarean incision this oozing was quite extensive on the left side and required a  couple of figure-of-eight sutures. Palpation revealed that the adhesions had extended all the way up to almost the right round ligament insertion. Due to the oozing pressure was applied for period of time and then Interceed applied hemostasis was then considered acceptable. The anterior peritoneum was closed side to side, to attempt to reduce future adhesion development. it should be noted that the extensive dense adhesions from the back side of the rectus muscles on the left, to the anterior surface of the uterus could not be completely transected and it is anticipated that the uterus will be quite extensively adherent to the anterior abdominal wall again after this case as it had probably been prior to pregnancy. Prior to closure the Foley bulb could be pulled up in the dome of the bladder and used as a visual aid to ensure that the bladder was indeed intact, and no area appeared thin or disrupted. Fascia was closed in a continuous running fashion with 0 Vicryl, with a reinforcing figure-of-eight suture in the very midline,& the subcutaneous tissues reapproximated with interrupted 2-0 Vicryl the stasis team good. The fatty tissues were irrigated with saline solution, dried, and subcuticular 4-0 Vicryl closure the skin incision completed the procedure. The patient had received D50 1 ampule during the recovery interventions after the unresponsive episode. By the end of the case the patient was alert responding appropriately, though sedated and headache completely resolved patient went to recovery in good condition the urine was checked and found to be clear without blood and patient was transferred to recovery room, with plans to obtain a CT of the head prior to transfer to the ICU for management of the glucose stabilizer and postoperative management. Condition to recovery room stable After completion of procedure the patient's support person and family were apprised of her condition and the events of the  cesarean. Sponge and needle counts correct

## 2014-01-04 NOTE — Progress Notes (Signed)
Rebecca Mann is a 23 y.o. G2P0101 at [redacted]w[redacted]d by ultrasound admitted for rupture of membranes diabetes,  Subjective: foley bulb extracted, cervix 4-5/20/-3 with vertex at inlet  contractions q 5 min.    Objective: BP 125/80  Pulse 70  Temp(Src) 97.9 F (36.6 C) (Oral)  Resp 18  Ht 5' (1.524 m)  Wt 72.213 kg (159 lb 3.2 oz)  BMI 31.09 kg/m2  SpO2 100%  LMP 04/10/2013    CBG (last 3)   Recent Labs  01/03/14 2219 01/03/14 2319 01/04/14 0017  GLUCAP 97 101* 95        FHT:  FHR: 145 bpm, variability: moderate,  accelerations:  Present,  decelerations:  Absent UC:   regular, every 5 minutes SVE:   Dilation: 1.5 Effacement (%): 50 Station: Ballotable Exam by:: Pincus Badder CNM  Labs: Lab Results  Component Value Date   WBC 7.4 01/03/2014   HGB 13.2 01/03/2014   HCT 39.2 01/03/2014   MCV 66.9* 01/03/2014   PLT 177 01/03/2014    Assessment / Plan: Protracted latent phase  Labor: on pitocin at 2 mu/min with foley, will increase and monitor for progress. I am suspicious this pt pelvis is not adequate Preeclampsia:   Fetal Wellbeing:  Category I Pain Control:  Epidural I/D:  n/a Anticipated MOD:  uncertain  Pericles Carmicheal V 01/04/2014, 12:48 AM

## 2014-01-04 NOTE — Transfer of Care (Signed)
Immediate Anesthesia Transfer of Care Note  Patient: Rebecca Mann  Procedure(s) Performed: Procedure(s): CESAREAN SECTION (N/A)  Patient Location: PACU  Anesthesia Type:Epidural  Level of Consciousness: awake, alert , oriented, patient cooperative and responds to stimulation  Airway & Oxygen Therapy: Patient Spontanous Breathing  Post-op Assessment: Report given to PACU RN and Post -op Vital signs reviewed and stable  Post vital signs: Reviewed and stable  Complications: No apparent anesthesia complications

## 2014-01-04 NOTE — Progress Notes (Signed)
UR completed 

## 2014-01-04 NOTE — Brief Op Note (Signed)
01/02/2014 - 01/04/2014  6:55 AM  PATIENT:  Rebecca Mann  23 y.o. female  PRE-OPERATIVE DIAGNOSIS:  Cephalopelvic disproportion, type I Diabetes, pregnancy 38wk 3 days  POST-OPERATIVE DIAGNOSIS:  Cephalopelvic disproportion with hypertensive crisis, seizure-like activity versus hypoglycemic reaction, type I diabetes, pelvic adhesions, extensive.  PROCEDURE:  Procedure(s): CESAREAN SECTION (N/A) repeat low transverse.   SURGEON:  Surgeon(s) and Role:    * Tilda Burrow, MD - Primary  PHYSICIAN ASSISTANT:   ASSISTANTS: none   ANESTHESIA:   local and epidural  EBL:  Total I/O In: 1800 [I.V.:1800] Out: 4550 [Urine:2950; Blood:1600]  BLOOD ADMINISTERED:none  DRAINS: Urinary Catheter (Foley)   LOCAL MEDICATIONS USED:  OTHER nesa  SPECIMEN:  Source of Specimen:  Placenta to labor and delivery  DISPOSITION OF SPECIMEN:  Labor and delivery  COUNTS:  YES  TOURNIQUET:  * No tourniquets in log *  DICTATION: .Dragon Dictation  PLAN OF CARE: Has admission orders already  PATIENT DISPOSITION:  PACU - hemodynamically stable.   Delay start of Pharmacological VTE agent (>24hrs) due to surgical blood loss or risk of bleeding: not applicable

## 2014-01-04 NOTE — Lactation Note (Signed)
This note was copied from the chart of Rebecca Mann. Lactation Consultation Note   Initial consult with this mom of a term baby, now 34 hours old. Mom is in AICU, and has her sister formula feeding the baby for now. Mom plans to pump , breast feed and formula feed. She agreed to begin pumping, so I set up premie setting, and showed mom how to hand express. I was not able to express any colostrum prior to pumping. Mom is a type 1 diabetic. Skin to skin encouraged, even if mom does not feel up to latching for now, and mom's sister agreed to watch the baby if mom needs to sleep.Lactation services reviewed.  Patient Name: Rebecca Mann Date: 01/04/2014 Reason for consult: Initial assessment   Maternal Data Formula Feeding for Exclusion: Yes (mom in AICU to recover fro seizure in OR prior to c-section) Reason for exclusion: Mother's choice to formula and breast feed on admission Has patient been taught Hand Expression?: Yes Does the patient have breastfeeding experience prior to this delivery?: Yes  Feeding Feeding Type: Bottle Fed - Formula Nipple Type: Slow - flow  LATCH Score/Interventions                      Lactation Tools Discussed/Used Tools: Pump Breast pump type: Double-Electric Breast Pump WIC Program: Yes (mom is active with [facility]) Pump Review: Setup, frequency, and cleaning;Milk Storage;Other (comment) (premie setting, hand expression, lactation services review) Initiated by:: clee rn lc at 6 hours post partum Date initiated:: 01/04/14   Consult Status Consult Status: Follow-up Date: 01/05/14 Follow-up type: In-patient    [name] 01/04/2014, 12:04 PM

## 2014-01-04 NOTE — Progress Notes (Signed)
Rebecca Mann is a 23 y.o. G2P0101 at [redacted]w[redacted]d by ultrasound admitted for rupture of membranes, type I diabetes mellitus, Prior cesarean desiring TOLAC, with consents in place  Subjective: Pt has been contracting actively q 3 mins, with FHR cat I, CBG's are optimal.  Objective: BP 93/73  Pulse 105  Temp(Src) 97.6 F (36.4 C) (Axillary)  Resp 18  Ht 5' (1.524 m)  Wt 72.213 kg (159 lb 3.2 oz)  BMI 31.09 kg/m2  SpO2 100%  LMP 04/10/2013   Total I/O In: -  Out: 800 [Urine:800]  FHT:  FHR: 145 bpm, variability: moderate,  accelerations:  Present,  decelerations:  Absent UC:   regular, every 3 minutes SVE:   Dilation: 4.5 Effacement (%): 20 Station: -3 Exam by:: Dr Rebecca Mann Cervix has made no change, and cervix has even thickened with edema at this high station. Vertex is at pelvic inlet, which is narrow. Labs: Lab Results  Component Value Date   WBC 7.4 01/03/2014   HGB 13.2 01/03/2014   HCT 39.2 01/03/2014   MCV 66.9* 01/03/2014   PLT 177 01/03/2014   CBG (last 3)   Recent Labs  01/04/14 0122 01/04/14 0230 01/04/14 0332  GLUCAP 103* 114* 68*      Assessment / Plan: Arrest of decent/ CPD  Labor: adequate Preeclampsia:   Fetal Wellbeing:  Category I Pain Control:  Epidural I/D:  n/a Anticipated MOD:  cesarean section.  Recommendation, risks of procedure,such as bleeding , infection, injury to adjacent organs discussed with patient, rationale for cesarean discussed with patient and family who agree with recomendation.  Pt has type and screen in place, and will proceed with non-emergent cesarean when OR ready.  Rebecca Mann V 01/04/2014, 4:20 AM

## 2014-01-04 NOTE — Anesthesia Postprocedure Evaluation (Signed)
  Anesthesia Post-op Note  Patient: Scientist, physiological  Procedure(s) Performed: Procedure(s): CESAREAN SECTION (N/A)  Patient Location: PACU  Anesthesia Type:Epidural  Level of Consciousness: awake, alert  and oriented  Airway and Oxygen Therapy: Patient Spontanous Breathing  Post-op Pain: none  Post-op Assessment: Post-op Vital signs reviewed, Patient's Cardiovascular Status Stable, Respiratory Function Stable, Patent Airway, No signs of Nausea or vomiting, Pain level controlled, No headache and No backache  Post-op Vital Signs: Reviewed and stable  Last Vitals:  Filed Vitals:   01/04/14 0800  BP: 108/65  Pulse: 95  Temp: 37.6 C  Resp: 17    Complications: No apparent anesthesia complications

## 2014-01-04 NOTE — Progress Notes (Signed)
1500 BP=87/43 right arm/pt sleeping on left side, HR=78 1503 Pt sleeping - easily aroused. BP recheck=92/41 HR=76 RR 12           Pt states "I can't see" 1510 CBG=63 pt given 4 oz apple juice & graham crackers w/peanutbutter. 1513 Called Orlando Penner, RN, Diabetic Coordinator with results & treatment - she will call MD & call back.  1515 No new orders. Levester Fresh will see pt 1531 Repeat CBG=58 juice & crackers with peanutbutter repeated. 1534 Spoke with Charisse March, RN @Diabetic  Coordinator office to give repeat CBG=58  1535 Reported to Dr - no new orders. 1550 CBG=106. Pt rec'd her meal (ordered @1440 ). Pt reports vision has improved 1555 Erin Fulling, RN, Diabetic Coordinator here - updated.

## 2014-01-05 ENCOUNTER — Encounter (HOSPITAL_COMMUNITY): Payer: Self-pay | Admitting: Obstetrics and Gynecology

## 2014-01-05 LAB — CBC
HCT: 22.6 % — ABNORMAL LOW (ref 36.0–46.0)
HEMOGLOBIN: 7.3 g/dL — AB (ref 12.0–15.0)
MCH: 22.1 pg — ABNORMAL LOW (ref 26.0–34.0)
MCHC: 32.3 g/dL (ref 30.0–36.0)
MCV: 68.5 fL — ABNORMAL LOW (ref 78.0–100.0)
PLATELETS: 136 10*3/uL — AB (ref 150–400)
RBC: 3.3 MIL/uL — ABNORMAL LOW (ref 3.87–5.11)
RDW: 14 % (ref 11.5–15.5)
WBC: 9.2 10*3/uL (ref 4.0–10.5)

## 2014-01-05 LAB — GLUCOSE, CAPILLARY
Glucose-Capillary: 137 mg/dL — ABNORMAL HIGH (ref 70–99)
Glucose-Capillary: 102 mg/dL — ABNORMAL HIGH (ref 70–99)
Glucose-Capillary: 77 mg/dL (ref 70–99)
Glucose-Capillary: 133 mg/dL — ABNORMAL HIGH (ref 70–99)
Glucose-Capillary: 95 mg/dL (ref 70–99)

## 2014-01-05 MED ORDER — FERROUS FUMARATE 325 (106 FE) MG PO TABS
1.0000 | ORAL_TABLET | Freq: Two times a day (BID) | ORAL | Status: DC
  Administered 2014-01-05 – 2014-01-06 (×3): 106 mg via ORAL
  Filled 2014-01-05 (×7): qty 1

## 2014-01-05 NOTE — Addendum Note (Signed)
Addendum created 01/05/14 0962 by Algis Greenhouse, CRNA   Modules edited: Notes Section   Notes Section:  File: [id number]

## 2014-01-05 NOTE — Lactation Note (Signed)
This note was copied from the chart of Rebecca Macala Coffie. Lactation Consultation Note  Patient Name: Rebecca Mann Date: 01/05/2014 Reason for consult: Follow-up assessment Mom is breast/bottle feeding. Mom reports she is BF with each feeding before supplementing. LC stressed importance of BF each feeding to encourage milk production, prevent engorgement and protect milk supply. Reviewed risk of early supplementation to BF success. Mom reports some nipple tenderness with latch. Denies any breakdown. Baby asleep at this visit, did not see latch. LC encouraged Mom to call with next feeding for assist due to nipple tenderness. Stressed importance of deep latch to prevent soreness, increase milk transfer. LC encourage Mom to consider post pumping at least 4 times per day to encourage milk production. Encouraged pumping due to history of IDDM, supplementing and questionable good depth with latch.  Advised Mom baby should be at the breast 8-12 times in 24 hours. Mom denies other questions or concerns.   Maternal Data    Feeding Feeding Type: Bottle Fed - Formula Length of feed: 15 min  LATCH Score/Interventions Latch: Grasps breast easily, tongue down, lips flanged, rhythmical sucking.  Audible Swallowing: None Intervention(s): Skin to skin  Type of Nipple: Everted at rest and after stimulation  Comfort (Breast/Nipple): Soft / non-tender     Hold (Positioning): Assistance needed to correctly position infant at breast and maintain latch.  LATCH Score: 7  Lactation Tools Discussed/Used Tools: Pump Breast pump type: Double-Electric Breast Pump   Consult Status Consult Status: Follow-up Date: 01/06/14 Follow-up type: In-patient    Rebecca Mann, Rebecca Mann 01/05/2014, 4:32 PM

## 2014-01-05 NOTE — Anesthesia Postprocedure Evaluation (Addendum)
Anesthesia Post Note  Patient: Scientist, physiological  Procedure(s) Performed: Procedure(s) (LRB): CESAREAN SECTION (N/A)  Anesthesia type: Epidural  Patient location: AICU  Post pain: Pain level controlled  Post assessment: Post-op Vital signs reviewed  Last Vitals:  Filed Vitals:   01/05/14 0700  BP: 115/71  Pulse: 96  Temp:   Resp:     Post vital signs: Reviewed  Level of consciousness:alert  Complications: No apparent anesthesia complications

## 2014-01-05 NOTE — Progress Notes (Addendum)
Subjective: Postpartum Day 1: Cesarean Delivery Patient reports feeling dizzy and lightheaded while ambulating to the bathroom. She denies CP or SOB. She is voiding and tolerating po    Objective: Vital signs in last 24 hours: Temp:  [98 F (36.7 C)-98.6 F (37 C)] 98.5 F (36.9 C) (09/18 1153) Pulse Rate:  [72-110] 90 (09/18 1153) Resp:  [12-18] 18 (09/18 1153) BP: (87-126)/(41-85) 115/62 mmHg (09/18 1153) SpO2:  [90 %-100 %] 99 % (09/18 1153) CBGs stable Physical Exam:  General: alert, cooperative and no distress Lochia: appropriate Uterine Fundus: firm and non tender Incision: dressing: clean/dry and intact DVT Evaluation: No evidence of DVT seen on physical exam. Negative Homan's sign.   Recent Labs  01/04/14 0907 01/05/14 0520  HGB 9.5* 7.3*  HCT 29.6* 22.6*    Assessment/Plan: Status post Cesarean section. Doing well postoperatively.  Patient with symptomatic anemia- offered blood transfusion but patient declined Will start iron supplement Patient may be transferred to routine postpartum floor.  Tamari Busic 01/05/2014, 12:06 PM

## 2014-01-05 NOTE — Addendum Note (Signed)
Addendum created 01/05/14 0759 by Algis Greenhouse, CRNA   Modules edited: Notes Section   Notes Section:  File: [id number]

## 2014-01-06 LAB — GLUCOSE, CAPILLARY
GLUCOSE-CAPILLARY: 97 mg/dL (ref 70–99)
GLUCOSE-CAPILLARY: 102 mg/dL — AB (ref 70–99)
GLUCOSE-CAPILLARY: 74 mg/dL (ref 70–99)
Glucose-Capillary: 80 mg/dL (ref 70–99)
Glucose-Capillary: 145 mg/dL — ABNORMAL HIGH (ref 70–99)
Glucose-Capillary: 141 mg/dL — ABNORMAL HIGH (ref 70–99)

## 2014-01-06 NOTE — Progress Notes (Signed)
Post Op Day 2 rLTCS with intraop seizure 2/2 hypoglycemia  Subjective: up ad lib, voiding, tolerating PO and + flatus Feels dizzy with standing since surgery. Has headache since surgery. Blurred vision however this is baseline.   Objective: Blood pressure 135/66, pulse 88, temperature 98.9 F (37.2 C), temperature source Oral, resp. rate 18, height 5' (1.524 m), weight 69.627 kg (153 lb 8 oz), last menstrual period 04/10/2013, SpO2 99.00%, unknown if currently breastfeeding.  Physical Exam:  General: alert, cooperative and no distress Lochia: appropriate Uterine Fundus: firm Incision: healing well, no significant drainage DVT Evaluation: No evidence of DVT seen on physical exam. Negative Homan's sign. No cords or calf tenderness.   Recent Labs  01/04/14 0907 01/05/14 0520  HGB 9.5* 7.3*  HCT 29.6* 22.6*    Assessment/Plan: Plan for discharge tomorrow Mirena Breast/bottle OP circ Lantus 15u qHS Needs PCP for DM  Spoke about the need for a PCP to handle insulin regimen   LOS: 4 days   Joanna Puff 01/06/2014, 7:38 AM  Evaluation and management procedures were performed by Resident physician under my supervision/collaboration. Chart reviewed, patient examined by me and I agree with management and plan.  Danae Orleans, CNM 01/06/2014 8:39 AM

## 2014-01-07 LAB — GLUCOSE, CAPILLARY
GLUCOSE-CAPILLARY: 74 mg/dL (ref 70–99)
GLUCOSE-CAPILLARY: 104 mg/dL — AB (ref 70–99)
Glucose-Capillary: 92 mg/dL (ref 70–99)

## 2014-01-07 MED ORDER — IBUPROFEN 600 MG PO TABS: 600.0000 mg | ORAL_TABLET | Freq: Four times a day (QID) | ORAL | Status: AC | PRN

## 2014-01-07 MED ORDER — INSULIN GLARGINE 100 UNIT/ML ~~LOC~~ SOLN: 15.0000 [IU] | Freq: Every day | SUBCUTANEOUS | Status: AC

## 2014-01-07 MED ORDER — OXYCODONE-ACETAMINOPHEN 5-325 MG PO TABS: 1.0000 | ORAL_TABLET | ORAL | Status: DC | PRN

## 2014-01-07 NOTE — Discharge Summary (Signed)
Agree with asssessment

## 2014-01-07 NOTE — Discharge Summary (Signed)
Obstetric Discharge Summary Reason for Admission: onset of labor Prenatal Procedures: none Intrapartum Procedures: cesarean: low cervical, transverse, hypertensive crisis, seizure-like activity vs hypoglycemic reaction Postpartum Procedures: none Complications-Operative and Postpartum: none Hemoglobin  Date Value Ref Range Status  01/05/2014 7.3* 12.0 - 15.0 g/dL Final     DELTA CHECK NOTED     REPEATED TO VERIFY  08/28/2013 12.3   Final     HCT  Date Value Ref Range Status  01/05/2014 22.6* 36.0 - 46.0 % Final  08/28/2013 39   Final    Discharge Diagnoses: Term Pregnancy-delivered  Hospital Course:  Rebecca Mann is a 23 y.o. G2P1101 who presented with SOL.  There was failure of descent therefore a rLTCS was preformed. During the operation, she was noted to have seizure like activity. She was found to be hypoglycemic at that time. Patient did well postoperatively.  She was able to ambulate, tolerate PO and void normally. She was discharged home with instructions for postpartum care.    Delivery Note PATIENT: Rebecca Mann 23 y.o. female  PRE-OPERATIVE DIAGNOSIS: Cephalopelvic disproportion, type I Diabetes, pregnancy 38wk 3 days  POST-OPERATIVE DIAGNOSIS: Cephalopelvic disproportion with hypertensive crisis, seizure-like activity versus hypoglycemic reaction, type I diabetes, pelvic adhesions, extensive.  PROCEDURE: Procedure(s):  CESAREAN SECTION (N/A) repeat low transverse.  SURGEON: Surgeon(s) and Role:  * Tilda Burrow, MD - Primary  PHYSICIAN ASSISTANT:  ASSISTANTS: none  ANESTHESIA: local and epidural  EBL: Total I/O  In: 1800 [I.V.:1800]  Out: 4550 [Urine:2950; Blood:1600]   Indications: Arrest of labor 5 cm long cervix -3 station with presenting part not in pelvis after augmentation of labor with blood sugars managed by glucose stabilizer. Last 3 AM blood sugar at 68 for which patient received oral juices remained asymptomatic.  Details of procedure: Patient was taken  to the operating room, epidural already in place. The patient was in room 1, abdomen prepped and draped with Foley catheter in place timeout was conducted, Ancef administered 2 g intravenously and analgesic levels checked. The patient had complaints of some discomfort on the right side so there was a delay as we waited for analgesic effect the patient then developed a significant headache concurrent with the development of severe hypertension to approximate to 220 systolic. Neo-Synephrine infusion to prevent epidural related hypotension was quickly discontinued. Blood pressures recovered quickly to normal also than 15 minutes. Patient was noted to have altered mentation, and then became unresponsive with jerking of her upper. Extremities, rapid response team was called and cesarean section progressed rapidly. The patient had a extensive adhesions in the anterior abdominal wall and we could not identify cleavage plane between anterior peritoneum and the uterus. An extra peritoneal approach to the uterus was developed. With the bladder retracted inferiorly, and lower uterine segment identified and found to be deviated to the patient's right wrist incision could be made and extended laterally shortly to the vertex and with the head delivered and. Some blood just below the anterior edge of the anterior fundal placenta suggestive of small abruption and there was some blood in the amniotic fluid. The baby was delivered promptly was depressed and quickly passed to the pediatrician for care. See their notes for details. Apgars 1 and 8 were assigned and baby went to regular nursery.  Placenta delivered easily. Membranes were removed, and uterus swabbed out. There was an inferior tension of the transverse incision on the right side approximately 4 cm in depth which was repaired as a separate closure with running locking 0  Monocryl the uterine incision was then closed transversely with a 2 layer closure there was a wide area  of oozing just above the uterine incision, as had been some dissection into the myometrium during the initial efforts to find the peritoneal opening, and this required an opening of the inferior edge of the vesicouterine peritoneal reflection, which was quite high and allowed an additional third layer of closure incorporating the uterine peritoneal surface to the upper aspects of the uterine cesarean incision this oozing was quite extensive on the left side and required a couple of figure-of-eight sutures. Palpation revealed that the adhesions had extended all the way up to almost the right round ligament insertion. Due to the oozing pressure was applied for period of time and then Interceed applied hemostasis was then considered acceptable. The anterior peritoneum was closed side to side, to attempt to reduce future adhesion development. it should be noted that the extensive dense adhesions from the back side of the rectus muscles on the left, to the anterior surface of the uterus could not be completely transected and it is anticipated that the uterus will be quite extensively adherent to the anterior abdominal wall again after this case as it had probably been prior to pregnancy.  Prior to closure the Foley bulb could be pulled up in the dome of the bladder and used as a visual aid to ensure that the bladder was indeed intact, and no area appeared thin or disrupted. Fascia was closed in a continuous running fashion with 0 Vicryl, with a reinforcing figure-of-eight suture in the very midline,& the subcutaneous tissues reapproximated with interrupted 2-0 Vicryl the stasis team good. The fatty tissues were irrigated with saline solution, dried, and subcuticular 4-0 Vicryl closure the skin incision completed the procedure.  The patient had received D50 1 ampule during the recovery interventions after the unresponsive episode. By the end of the case the patient was alert responding appropriately, though sedated and  headache completely resolved patient went to recovery in good condition the urine was checked and found to be clear without blood and patient was transferred to recovery room, with plans to obtain a CT of the head prior to transfer to the ICU for management of the glucose stabilizer and postoperative management. Condition to recovery room stable  After completion of procedure the patient's support person and family were apprised of her condition and the events of the cesarean. Sponge and needle counts correct  Physical Exam:  General: alert, cooperative and no distress Lochia: appropriate Uterine Fundus: firm DVT Evaluation: No evidence of DVT seen on physical exam. Negative Homan's sign. No cords or calf tenderness.  Discharge Information: Date: 01/07/2014 Activity: pelvic rest Diet: routine Medications: PNV, Ibuprofen, Colace and Percocet Baby feeding: plans to breastfeed, plans to bottle feed Contraception: IUD Condition: stable Instructions: refer to practice specific booklet Discharge to: home DM:  Prior to pregnancy, patient was on Lantus 30u qHS and Novolog 15 units with breakfast, lunch, and dinner. Patient's CBGs stable from 74-145 with Lantus 15 units postpartum. Will discharge on Lantus 15 with instructions to add back Novolo 15u TID if she starts to become hyperglycemic.  Information given on PCPs, as patient requires someone to manage her type I DM   Newborn Data: Live born female  Birth Weight: 7 lb 15.1 oz (3603 g) APGAR: 1, 8  Home with mother.  Aggie Cosier, MD [facility] PGY-1 01/07/2014, 7:25 AM

## 2014-01-07 NOTE — Discharge Instructions (Signed)
Congratulations!  I am discharging you back on Lantus (what you used prior to being pregnant). You have not been requiring any Novolog while you were here (the insulin you normally take three times a day with meals). Please continue taking Lantus 15 units at bedtime. Please check your blood sugars on a regular basis and if your blood sugars are greater than 150, begin adding back Novolog with meals as we discussed.  Please establish with a primary care physician soon, as they would be better suited for managing your diabetes.    Postpartum Care After Cesarean Delivery After you deliver your newborn (postpartum period), the usual stay in the hospital is 24-72 hours. If there were problems with your labor or delivery, or if you have other medical problems, you might be in the hospital longer.  While you are in the hospital, you will receive help and instructions on how to care for yourself and your newborn during the postpartum period.  While you are in the hospital:  It is normal for you to have pain or discomfort from the incision in your abdomen. Be sure to tell your nurses when you are having pain, where the pain is located, and what makes the pain worse.  If you are breastfeeding, you may feel uncomfortable contractions of your uterus for a couple of weeks. This is normal. The contractions help your uterus get back to normal size.  It is normal to have some bleeding after delivery.  For the first 1-3 days after delivery, the flow is red and the amount may be similar to a period.  It is common for the flow to start and stop.  In the first few days, you may pass some small clots. Let your nurses know if you begin to pass large clots or your flow increases.  Do not  flush blood clots down the toilet before having the nurse look at them.  During the next 3-10 days after delivery, your flow should become more watery and pink or brown-tinged in color.  Ten to fourteen days after delivery,  your flow should be a small amount of yellowish-white discharge.  The amount of your flow will decrease over the first few weeks after delivery. Your flow may stop in 6-8 weeks. Most women have had their flow stop by 12 weeks after delivery.  You should change your sanitary pads frequently.  Wash your hands thoroughly with soap and water for at least 20 seconds after changing pads, using the toilet, or before holding or feeding your newborn.  Your intravenous (IV) tubing will be removed when you are drinking enough fluids.  The urine drainage tube (urinary catheter) that was inserted before delivery may be removed within 6-8 hours after delivery or when feeling returns to your legs. You should feel like you need to empty your bladder within the first 6-8 hours after the catheter has been removed.  In case you become weak, lightheaded, or faint, call your nurse before you get out of bed for the first time and before you take a shower for the first time.  Within the first few days after delivery, your breasts may begin to feel tender and full. This is called engorgement. Breast tenderness usually goes away within 48-72 hours after engorgement occurs. You may also notice milk leaking from your breasts. If you are not breastfeeding, do not stimulate your breasts. Breast stimulation can make your breasts produce more milk.  Spending as much time as possible with your newborn is  very important. During this time, you and your newborn can feel close and get to know each other. Having your newborn stay in your room (rooming in) will help to strengthen the bond with your newborn. It will give you time to get to know your newborn and become comfortable caring for your newborn.  Your hormones change after delivery. Sometimes the hormone changes can temporarily cause you to feel sad or tearful. These feelings should not last more than a few days. If these feelings last longer than that, you should talk to your  caregiver.  If desired, talk to your caregiver about methods of family planning or contraception.  Talk to your caregiver about immunizations. Your caregiver may want you to have the following immunizations before leaving the hospital:  Tetanus, diphtheria, and pertussis (Tdap) or tetanus and diphtheria (Td) immunization. It is very important that you and your family (including grandparents) or others caring for your newborn are up-to-date with the Tdap or Td immunizations. The Tdap or Td immunization can help protect your newborn from getting ill.  Rubella immunization.  Varicella (chickenpox) immunization.  Influenza immunization. You should receive this annual immunization if you did not receive the immunization during your pregnancy. Document Released: 12/30/2011 Document Reviewed: 12/30/2011 [facility] Patient Information 2015 [place], [place]. This information is not intended to replace advice given to you by your health care provider. Make sure you discuss any questions you have with your health care provider.

## 2014-01-08 ENCOUNTER — Inpatient Hospital Stay (HOSPITAL_COMMUNITY): Admission: RE | Admit: 2014-01-08 | Payer: Medicaid Other | Source: Ambulatory Visit

## 2014-01-24 ENCOUNTER — Ambulatory Visit (INDEPENDENT_AMBULATORY_CARE_PROVIDER_SITE_OTHER): Payer: Medicaid Other | Admitting: Physician Assistant

## 2014-01-24 VITALS — BP 133/86 | HR 82 | Temp 98.6°F

## 2014-01-24 DIAGNOSIS — Z4889 Encounter for other specified surgical aftercare: Secondary | ICD-10-CM

## 2014-01-24 MED ORDER — OXYCODONE-ACETAMINOPHEN 5-325 MG PO TABS: 1.0000 | ORAL_TABLET | ORAL | Status: AC | PRN

## 2014-01-24 NOTE — Progress Notes (Signed)
Pt had Cesarean Section on 9/17 and is having swelling and drainage since last week.

## 2014-01-24 NOTE — Progress Notes (Signed)
Patient ID: Rebecca Mann, female   DOB: 08/09/1990, 23 y.o.   MRN: [id number] S: Bennetta Laos 23 y.o. presents 4 weeks after c-section due to pain/wound from incision.  She reports her incision has been extremely painful on the left. She states it smells bad and is open.  She denies fever, drainage.   O: Incision appears to be well healed except for left most lateral 3cm which is open very superficially.  Pink tissue visible with no surrounding erythema or warmth.  No smell is noted.  Pt is exquisitely tender to mild touch.    A: various stages of healing of c-section  P: Percocet is refilled #15 to be used sparingly.  Advise ibuprofen routinely x 3-5 days and then prn. Return for postpartum visit in 2 weeks Continue PNV.

## 2014-02-05 ENCOUNTER — Encounter: Payer: Self-pay | Admitting: Nurse Practitioner

## 2014-02-05 ENCOUNTER — Ambulatory Visit (INDEPENDENT_AMBULATORY_CARE_PROVIDER_SITE_OTHER): Payer: Medicaid Other | Admitting: Nurse Practitioner

## 2014-02-05 VITALS — BP 120/72 | HR 77 | Temp 98.7°F | Ht 61.0 in | Wt 139.8 lb

## 2014-02-05 DIAGNOSIS — Z308 Encounter for other contraceptive management: Secondary | ICD-10-CM

## 2014-02-05 DIAGNOSIS — Z30017 Encounter for initial prescription of implantable subdermal contraceptive: Secondary | ICD-10-CM

## 2014-02-05 DIAGNOSIS — Z01812 Encounter for preprocedural laboratory examination: Secondary | ICD-10-CM

## 2014-02-05 LAB — POCT PREGNANCY, URINE: PREG TEST UR: NEGATIVE

## 2014-02-05 MED ORDER — ETONOGESTREL 68 MG ~~LOC~~ IMPL
68.0000 mg | DRUG_IMPLANT | Freq: Once | SUBCUTANEOUS | Status: AC
  Administered 2014-02-05: 68 mg via SUBCUTANEOUS

## 2014-02-05 NOTE — Patient Instructions (Signed)
Etonogestrel implant What is this medicine? ETONOGESTREL (et oh noe JES trel) is a contraceptive (birth control) device. It is used to prevent pregnancy. It can be used for up to 3 years. This medicine may be used for other purposes; ask your health care provider or pharmacist if you have questions. COMMON BRAND NAME(S): Implanon, Nexplanon What should I tell my health care provider before I take this medicine? They need to know if you have any of these conditions: -abnormal vaginal bleeding -blood vessel disease or blood clots -cancer of the breast, cervix, or liver -depression -diabetes -gallbladder disease -headaches -heart disease or recent heart attack -high blood pressure -high cholesterol -kidney disease -liver disease -renal disease -seizures -tobacco smoker -an unusual or allergic reaction to etonogestrel, other hormones, anesthetics or antiseptics, medicines, foods, dyes, or preservatives -pregnant or trying to get pregnant -breast-feeding How should I use this medicine? This device is inserted just under the skin on the inner side of your upper arm by a health care professional. Talk to your pediatrician regarding the use of this medicine in children. Special care may be needed. Overdosage: If you think you've taken too much of this medicine contact a poison control center or emergency room at once. Overdosage: If you think you have taken too much of this medicine contact a poison control center or emergency room at once. NOTE: This medicine is only for you. Do not share this medicine with others. What if I miss a dose? This does not apply. What may interact with this medicine? Do not take this medicine with any of the following medications: -amprenavir -bosentan -fosamprenavir This medicine may also interact with the following medications: -barbiturate medicines for inducing sleep or treating seizures -certain medicines for fungal infections like ketoconazole and  itraconazole -griseofulvin -medicines to treat seizures like carbamazepine, felbamate, oxcarbazepine, phenytoin, topiramate -modafinil -phenylbutazone -rifampin -some medicines to treat HIV infection like atazanavir, indinavir, lopinavir, nelfinavir, tipranavir, ritonavir -St. John's wort This list may not describe all possible interactions. Give your health care provider a list of all the medicines, herbs, non-prescription drugs, or dietary supplements you use. Also tell them if you smoke, drink alcohol, or use illegal drugs. Some items may interact with your medicine. What should I watch for while using this medicine? This product does not protect you against HIV infection (AIDS) or other sexually transmitted diseases. You should be able to feel the implant by pressing your fingertips over the skin where it was inserted. Tell your doctor if you cannot feel the implant. What side effects may I notice from receiving this medicine? Side effects that you should report to your doctor or health care professional as soon as possible: -allergic reactions like skin rash, itching or hives, swelling of the face, lips, or tongue -breast lumps -changes in vision -confusion, trouble speaking or understanding -dark urine -depressed mood -general ill feeling or flu-like symptoms -light-colored stools -loss of appetite, nausea -right upper belly pain -severe headaches -severe pain, swelling, or tenderness in the abdomen -shortness of breath, chest pain, swelling in a leg -signs of pregnancy -sudden numbness or weakness of the face, arm or leg -trouble walking, dizziness, loss of balance or coordination -unusual vaginal bleeding, discharge -unusually weak or tired -yellowing of the eyes or skin Side effects that usually do not require medical attention (Report these to your doctor or health care professional if they continue or are bothersome.): -acne -breast pain -changes in  weight -cough -fever or chills -headache -irregular menstrual bleeding -itching, burning, and   vaginal discharge -pain or difficulty passing urine -sore throat This list may not describe all possible side effects. Call your doctor for medical advice about side effects. You may report side effects to FDA at 1-800-FDA-1088. Where should I keep my medicine? This drug is given in a hospital or clinic and will not be stored at home. NOTE: This sheet is a summary. It may not cover all possible information. If you have questions about this medicine, talk to your doctor, pharmacist, or health care provider.  2015, Elsevier/Gold Standard. (2011-10-12 15:37:45)  

## 2014-02-05 NOTE — Progress Notes (Addendum)
Patient ID: Rebecca Mann, female   DOB: [birth year], 23 y.o.   MRN: [id number] Subjective:     Rebecca Mann is a 23 y.o. female who presents for a postpartum visit. She is 4 week postpartum following a low cervical transverse Cesarean section. I have fully reviewed the prenatal and intrapartum course. The delivery was at 37  gestational weeks. Outcome: repeat cesarean section, low transverse incision. Anesthesia: epidural. Postpartum course has been uneventful, still taking insulin 18 units in am and hs and 16 units with each meal, has not established care elsewhere. Baby's course has been uneventful. Baby is feeding by bottle - Similac Advance. Bleeding no bleeding. Bowel function is normal. Bladder function is normal. Patient is not sexually active. Contraception method is Nexplanon. Postpartum depression screening: negative.  The following portions of the patient's history were reviewed and updated as appropriate: current medications, past family history, past medical history, past social history, past surgical history and problem list.  Review of Systems Pertinent items are noted in HPI.   Objective:    BP 120/72  Pulse 77  Temp(Src) 98.7 F (37.1 C)  Ht 5\' 1"  (1.549 m)  Wt 139 lb 12.8 oz (63.413 kg)  BMI 26.43 kg/m2  Breastfeeding? No  General:  alert, cooperative, appears stated age and no distress   Breasts:  inspection negative, no nipple discharge or bleeding, no masses or nodularity palpable and not examined  Lungs: clear to auscultation bilaterally  Heart:  regular rate and rhythm, S1, S2 normal, no murmur, click, rub or gallop  Abdomen: soft, non-tender; bowel sounds normal; no masses,  no organomegaly and incision well healed   Vulva:  not evaluated  Vagina: not evaluated  Cervix:  not examined  Corpus: not examined  Adnexa:  not evaluated  .lmbRectal Exam: Not performed.        NEXPLANON INSERTION  Patient given informed consent, signed copy in the chart, time out  was performed. Pregnancy test was negative. Appropriate time out taken.  Patient's left arm was prepped and draped in the usual sterile fashion.. The ruler used to measure and mark insertion area.  Pt was prepped with alcohol swab and then injected with 3cc of 1% lidocaine with epinephrine.  Pt was prepped with betadine, Nexplanon removed form packaging,  Device confirmed in needle, then inserted full length of needle and withdrawn per handbook instructions.  Pt insertion site covered with sterile dressing.   Minimal blood loss.  Pt tolerated the procedure well.   Results for orders placed in visit on 02/05/14 (from the past 24 hour(s))  POCT PREGNANCY, URINE     Status: None   Collection Time    02/05/14  2:23 PM      Result Value Ref Range   Preg Test, Ur NEGATIVE  NEGATIVE     Assessment:    postpartum exam. Pap smear not done at today's visit.   Plan:    1. Contraception: Nexplanon 2. IDDM/ Referred to [facility] -[phone] 3. Follow up in: 1 year or as needed.

## 2014-02-19 ENCOUNTER — Encounter: Payer: Self-pay | Admitting: Nurse Practitioner

## 2014-03-20 ENCOUNTER — Encounter: Payer: Self-pay | Admitting: Obstetrics & Gynecology

## 2014-05-02 ENCOUNTER — Telehealth: Payer: Self-pay | Admitting: *Deleted

## 2014-05-02 DIAGNOSIS — O24013 Pre-existing diabetes mellitus, type 1, in pregnancy, third trimester: Secondary | ICD-10-CM

## 2014-05-02 NOTE — Telephone Encounter (Signed)
Pt called the clinic requesting a referral to be sent to [facility] for Diabetes.  Contacted patient, pt has not seen a Physician for Diabetes since her delivery,  Pt was informed to go to [facility], she did not know that she was suppose to make an appointment.  We will call and make an appointment with [facility] and then they can refer her to [facility].

## 2014-05-10 NOTE — Telephone Encounter (Signed)
Talk to Dr Adrian Blackwater, okay to refer patient to [facility]. Called patient and informed her of referral to [facility] and that they will send her a packet in the mail to fill out her health history and to mail that back and they will contact her with an appt. Also provided contact info and address. Patient verbalized understanding to all and had no questions. Contacted Jackie at [facility] and informed of patient referral and importance of starting care soon due to currently not being managed for type 1 diabetes. Annice Pih will contact patient today.

## 2015-07-10 IMAGING — US US OB FOLLOW-UP
1 series · 12 of 28 positions shown · non-contrast
Comparison: none

[Series 1: us ob follow-up · 12 of 42 slices shown]
[im 2/42]
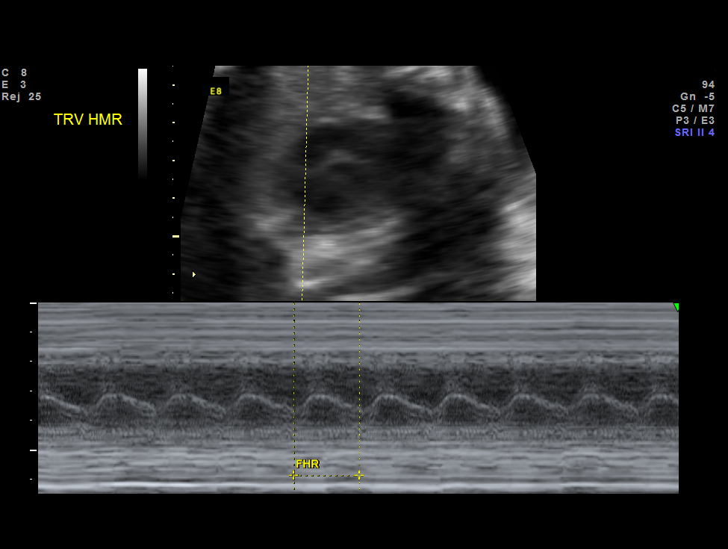
[im 5/42]
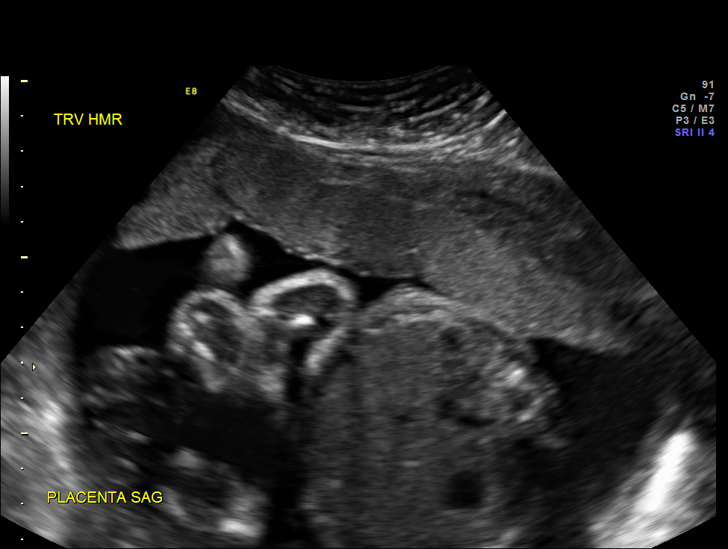
[im 8/42]
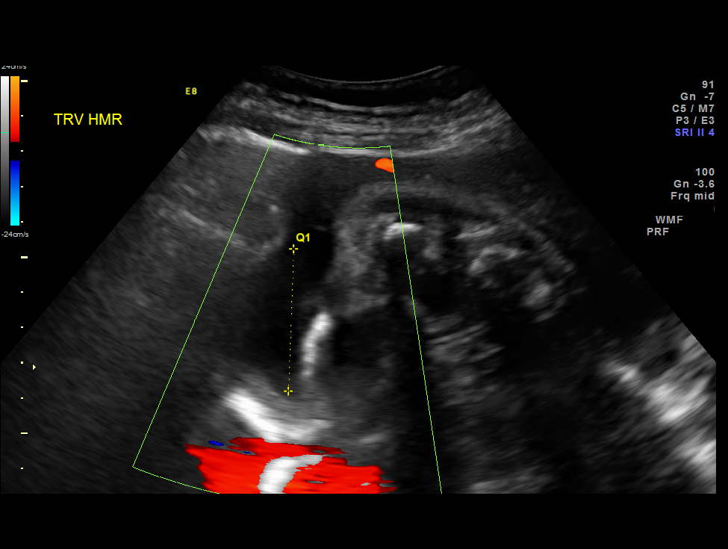
[im 13/42]
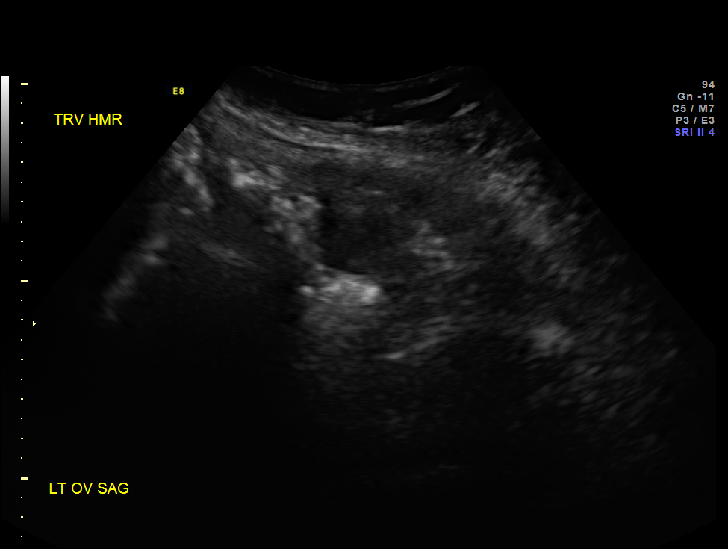
[im 16/42]
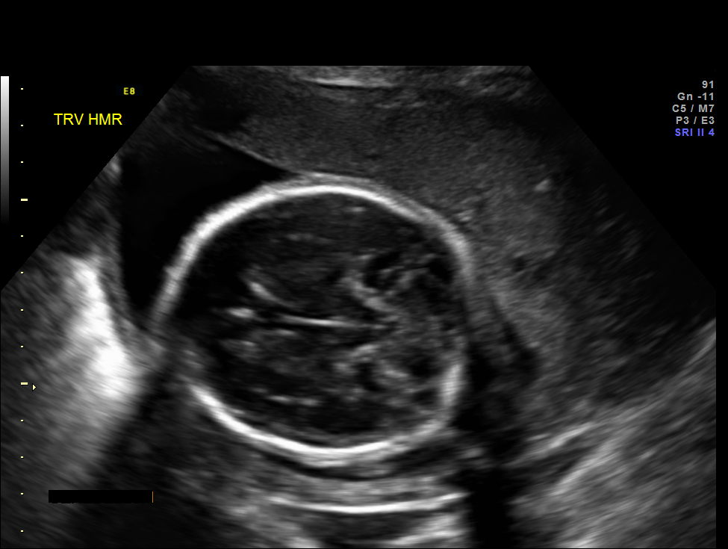
[im 19/42]
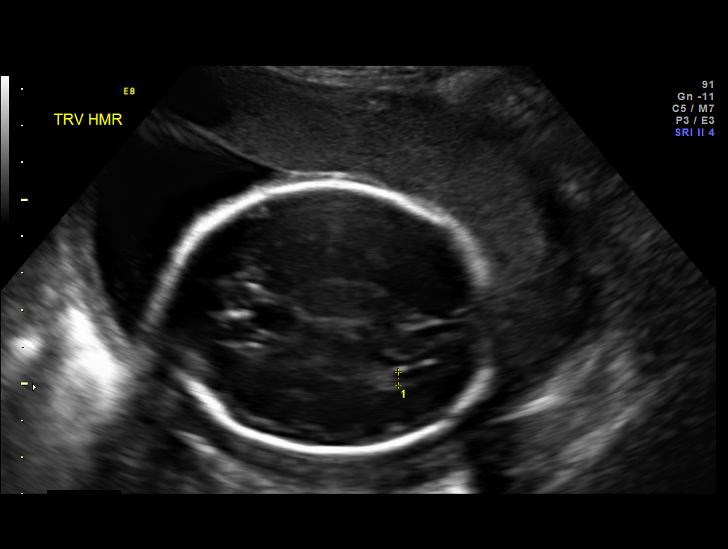
[im 23/42]
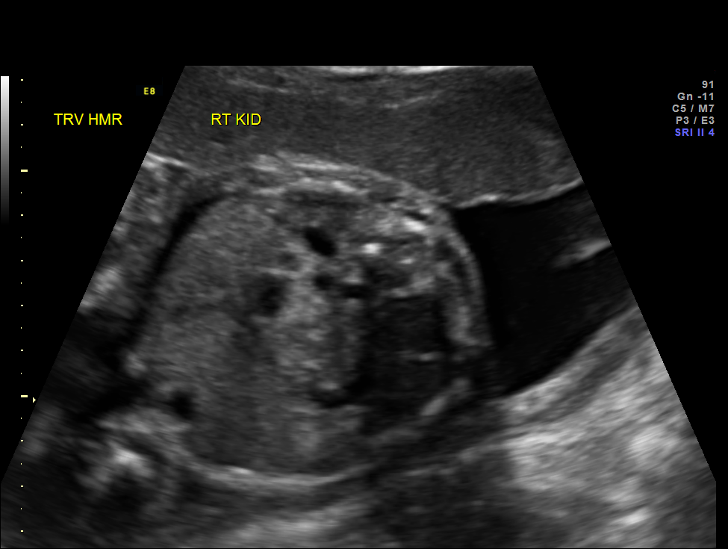
[im 26/42]
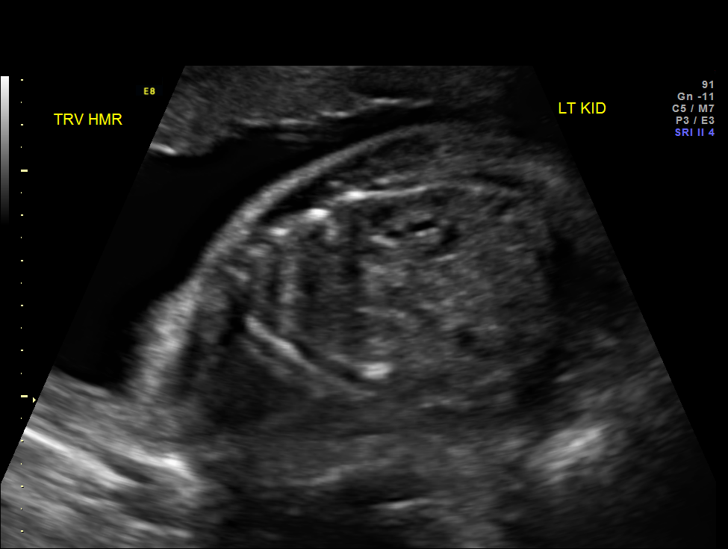
[im 29/42]
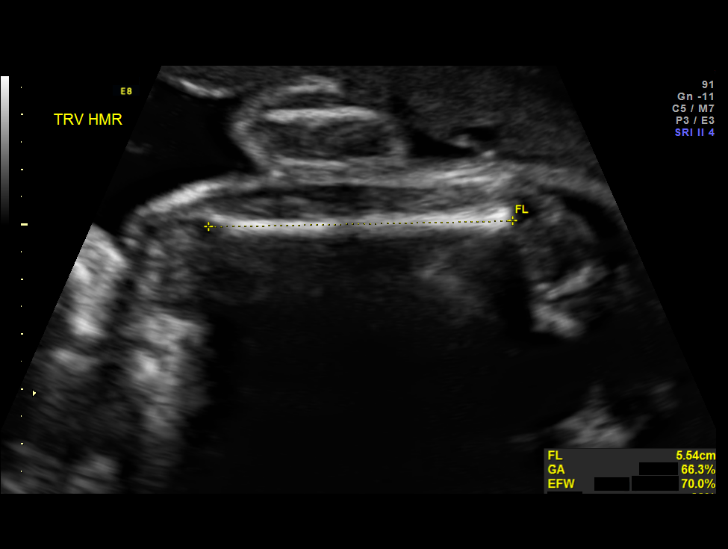
[im 34/42]
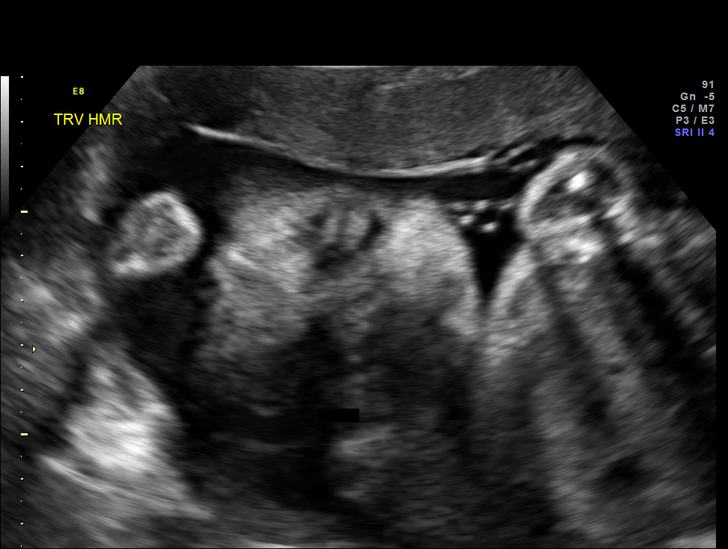
[im 37/42]
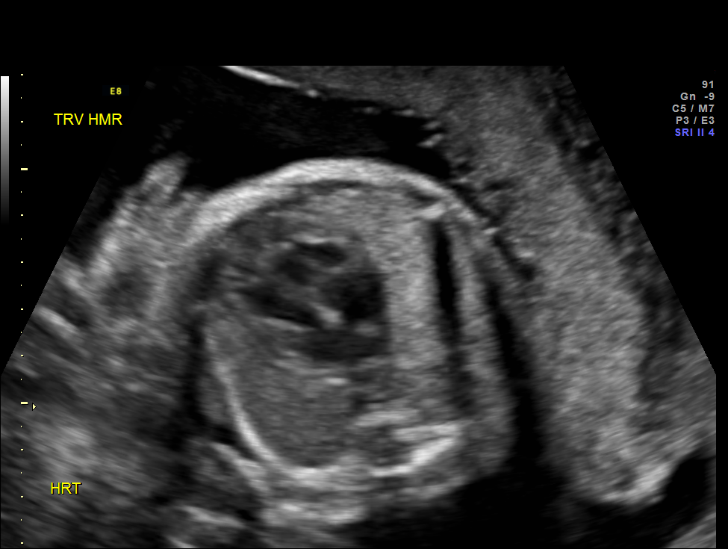
[im 40/42]
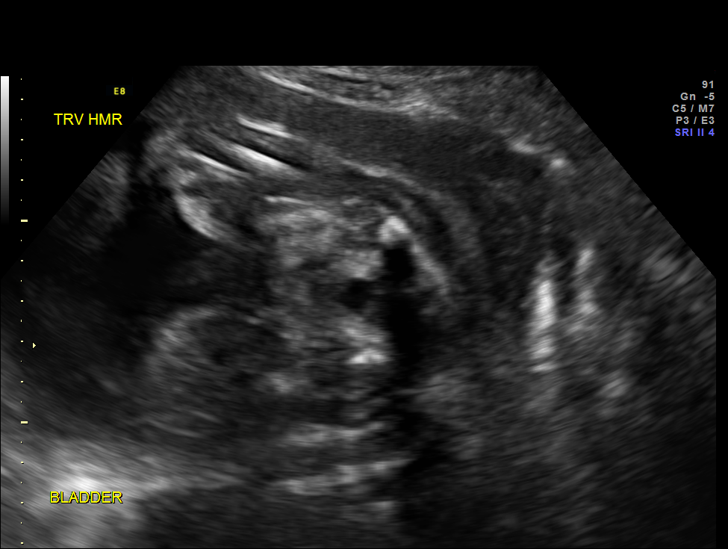

[12 of 28 positions shown; findings below may reference images not displayed]

OBSTETRICS REPORT
                      (Signed Final 10/24/2013 [DATE])

Service(s) Provided

 US OB FOLLOW UP                                       76816.1
Indications

 Diabetes - Pregestational, Class B (onset [AGE]
 or >, with duration < 36yrs)
 Poor obstetric history: Previous preeclampsia /
 eclampsia/gestational HTN
 Previous cesarean section
Fetal Evaluation

 Num Of Fetuses:    1
 Fetal Heart Rate:  153                          bpm
 Cardiac Activity:  Observed
 Presentation:      Transverse, head to
                    maternal right
 Placenta:          Anterior, above cervical os
 P. Cord            Previously Visualized
 Insertion:

 Amniotic Fluid
 AFI FV:      Subjectively within normal limits
 AFI Sum:     16.15   cm       59  %Tile     Larg Pckt:    5.03  cm
 RUQ:   4.04    cm   RLQ:    4.37   cm    LUQ:   5.03    cm   LLQ:    2.71   cm
Biometry

 BPD:     71.8  mm     G. Age:  28w 6d                CI:         77.5   70 - 86
 OFD:     92.6  mm                                    FL/HC:      20.8   18.8 -

 HC:     264.7  mm     G. Age:  28w 6d       40  %    HC/AC:      1.04   1.05 -

 AC:     255.5  mm     G. Age:  29w 5d       85  %    FL/BPD:     76.6   71 - 87
 FL:        55  mm     G. Age:  29w 0d       61  %    FL/AC:      21.5   20 - 24
 HUM:     47.1  mm     G. Age:  27w 5d       37  %
 CER:     34.3  mm     G. Age:  29w 5d       74  %

 Est. FW:    0188  gm      3 lb 1 oz     75  %
Gestational Age

 LMP:           28w 1d        Date:  04/10/13                 EDD:   01/15/14
 U/S Today:     29w 1d                                        EDD:   01/08/14
 Best:          28w 1d     Det. By:  LMP  (04/10/13)          EDD:   01/15/14
Anatomy

 Cranium:          Appears normal         Aortic Arch:      Previously seen
 Fetal Cavum:      Appears normal         Ductal Arch:      Previously seen
 Ventricles:       Appears normal         Diaphragm:        Previously seen
 Choroid Plexus:   Previously seen        Stomach:          Appears normal, left
                                                            sided
 Cerebellum:       Previously seen        Abdomen:          Appears normal
 Posterior Fossa:  Previously seen        Abdominal Wall:   Previously seen
 Nuchal Fold:      Not applicable (>20    Cord Vessels:     Previously seen
                   wks GA)
 Face:             Orbits and profile     Kidneys:          Appear normal
                   previously seen
 Lips:             Previously seen        Bladder:          Appears normal
 Heart:            Appears normal         Spine:            Previously seen
                   (4CH, axis, and
                   situs)
 RVOT:             Previously seen        Lower             Previously seen
                                          Extremities:
 LVOT:             Previously seen        Upper             Previously seen
                                          Extremities:

 Other:  Fetus appears to be a male. Heels previously visualized. Nasal bone
         previously visualized.
Targeted Anatomy

 Fetal Central Nervous System
 Cisterna Magna:
Cervix Uterus Adnexa

 Cervical Length:    4.1      cm

 Cervix:       Normal appearance by transabdominal scan.

 Adnexa:     No abnormality visualized.
Impression

 Single IUP at 28w 1d
 Poorly controlled Class B diabetes
 The estimated fetal weight today is at the 75th %tile.
 Normal interval anatomy, although somewhat limited due to
 late gestational age
 Normal amniotic fluid volume
Recommendations

 Recommend follow-up ultrasound examination in 4 weeks for
 interval growth
 Fetal echo tentatively scheduled tomorrow

 questions or concerns.

## 2015-09-20 IMAGING — CT CT HEAD W/O CM
2 series · 16 of 30 positions shown, 20 images · non-contrast
Comparison: None.

CLINICAL DATA: Seizure activity during OR.

EXAM:
CT HEAD WITHOUT CONTRAST
TECHNIQUE: Contiguous axial images were obtained from the base of the skull
through the vertex without intravenous contrast.

[Series 2: routine head w/o · axial · non-contrast · 0.39mm/px · z∈[+1161,+1287]mm · 13 of 32 slices shown, 17 images]
[im 3/32  brain]
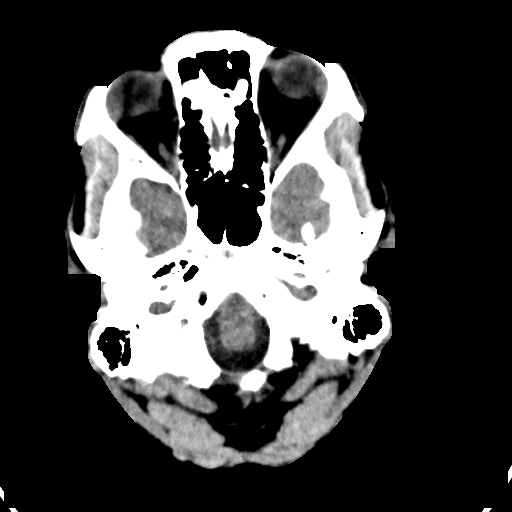
[im 3/32  bone]
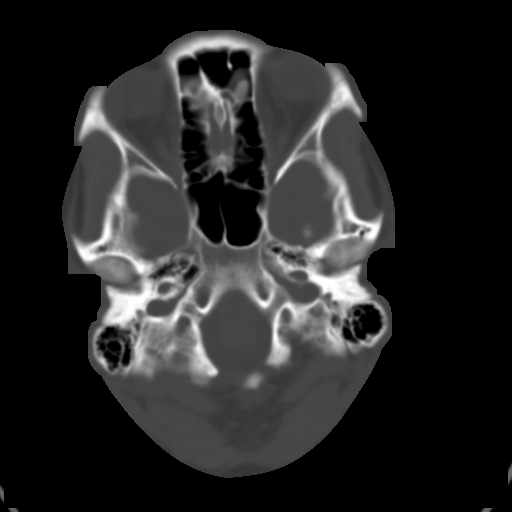
[im 5/32  brain]
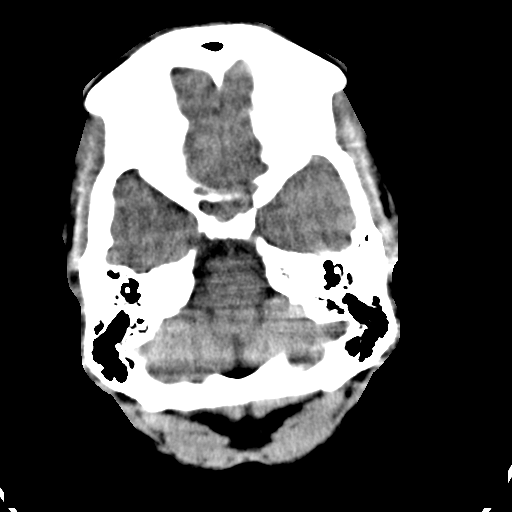
[im 7/32  brain]
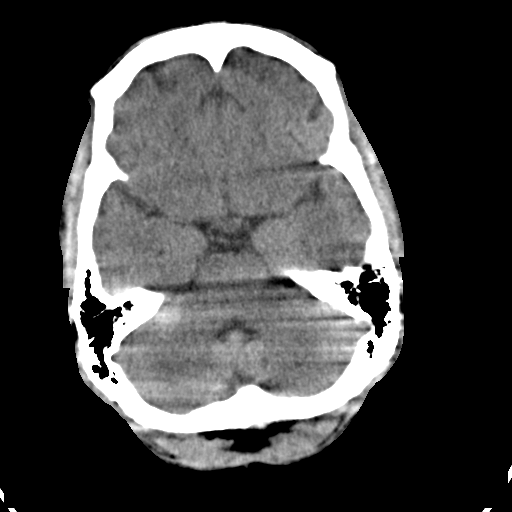
[im 9/32  brain]
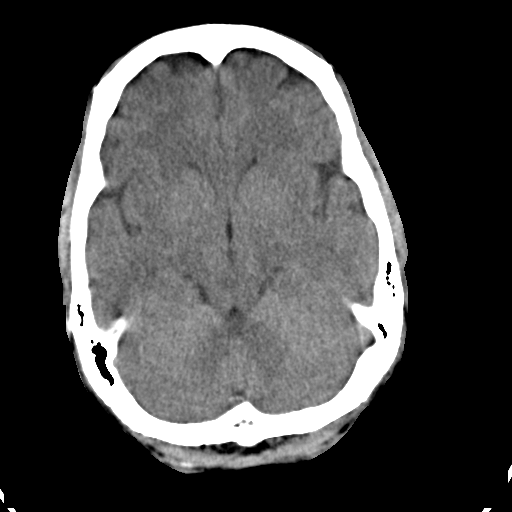
[im 12/32  brain]
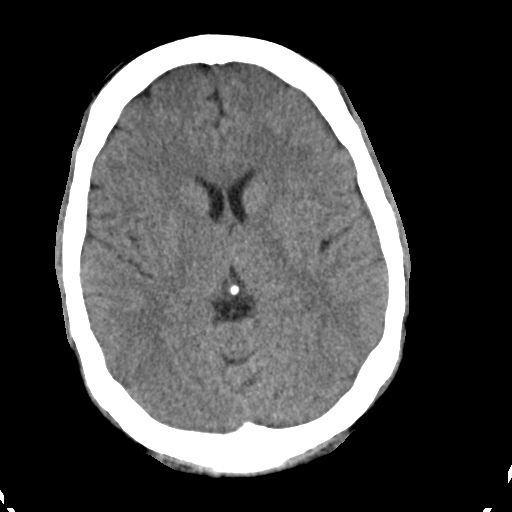
[im 12/32  bone]
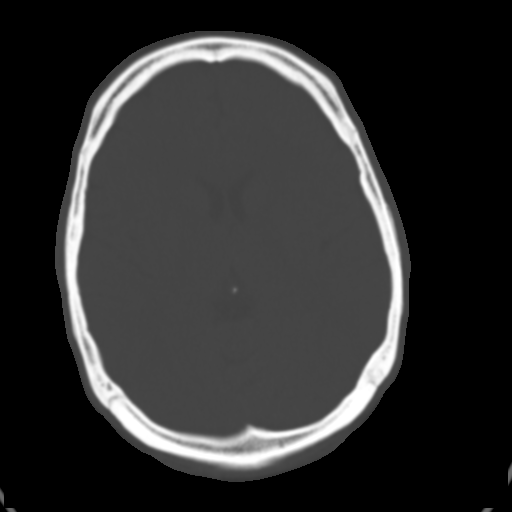
[im 14/32  brain]
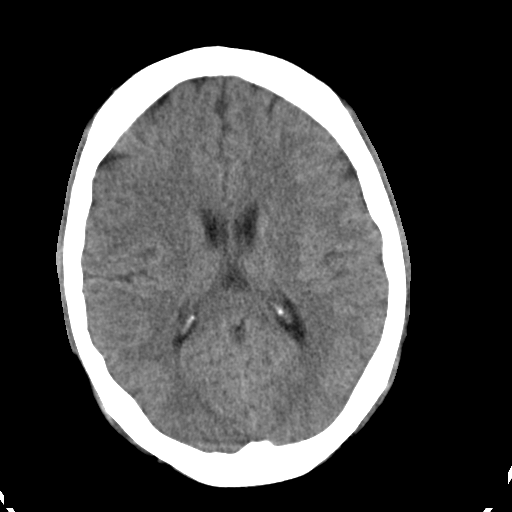
[im 16/32  brain]
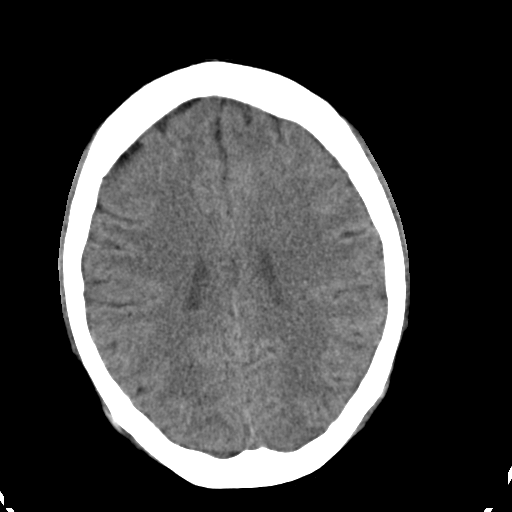
[im 18/32  brain]
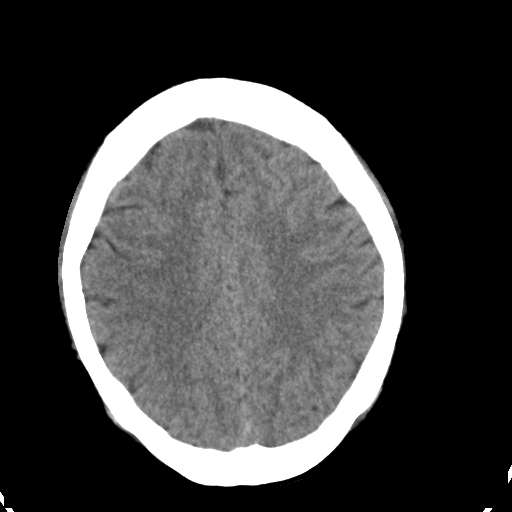
[im 20/32  brain]
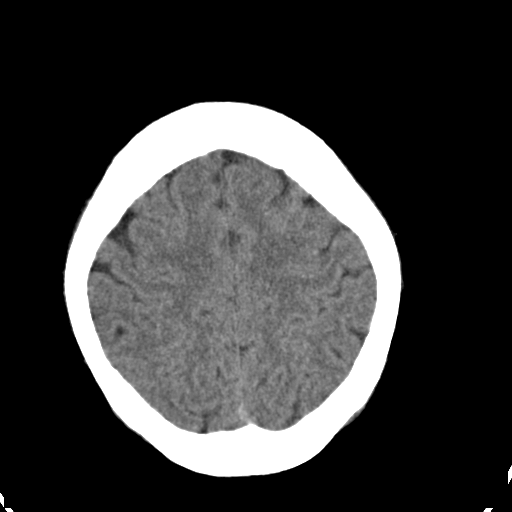
[im 20/32  bone]
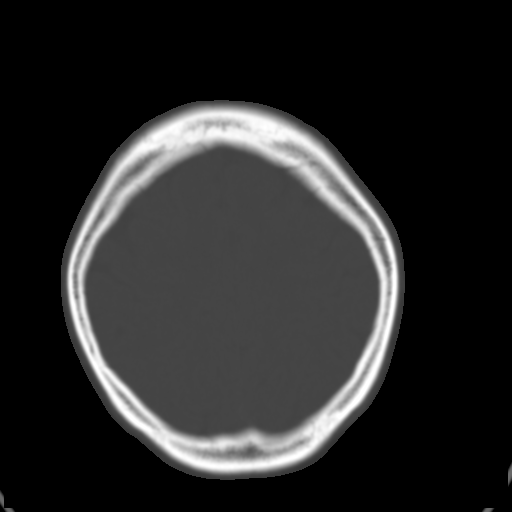
[im 23/32  brain]
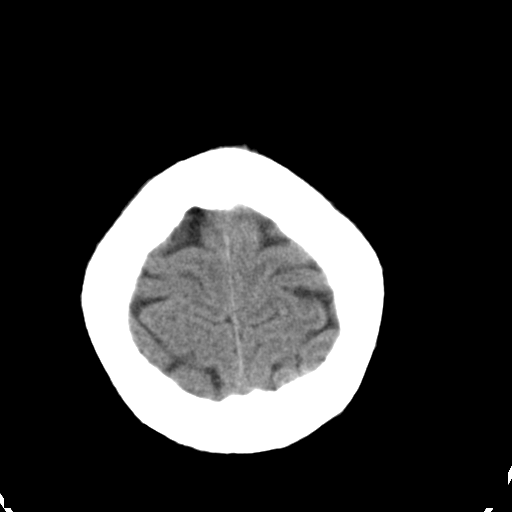
[im 25/32  brain]
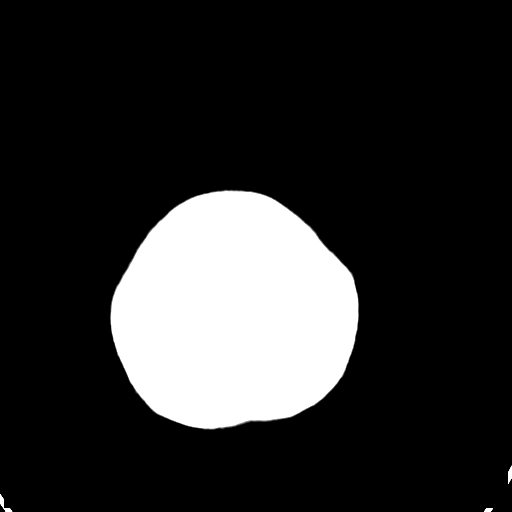
[im 27/32  brain]
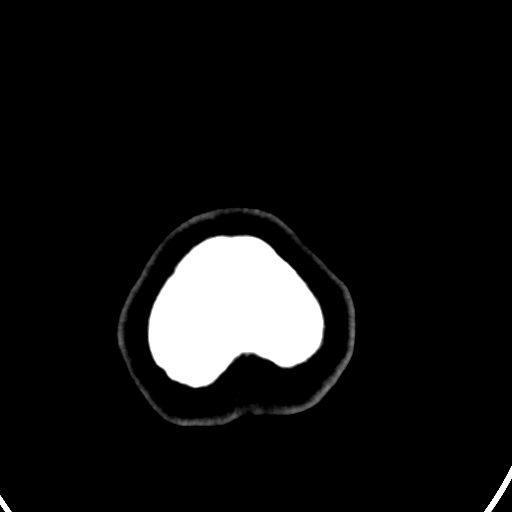
[im 29/32  brain]
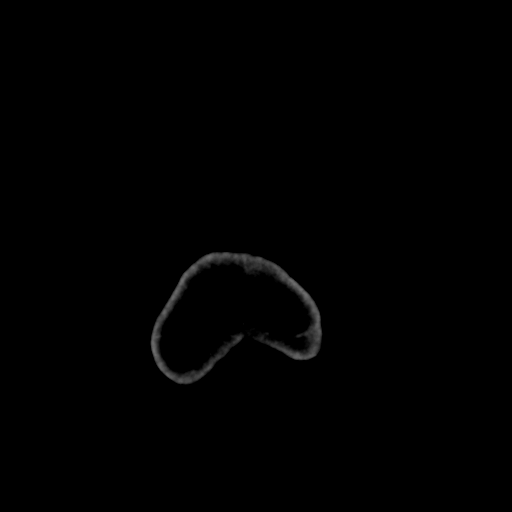
[im 29/32  bone]
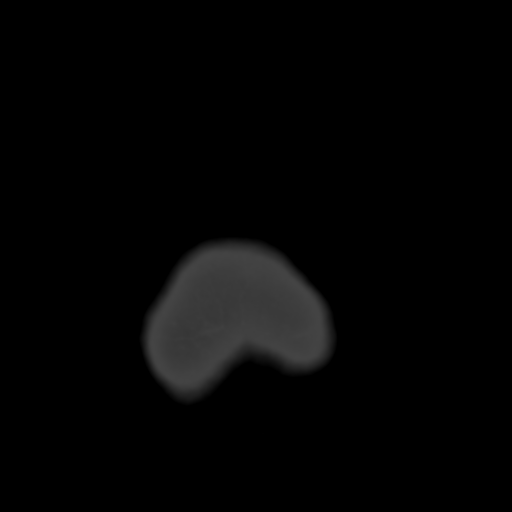

[Series 3: routine head bone · axial · 0.39mm/px · z∈[+1161,+1204]mm · 3 of 32 slices shown]
[im 3/32  bone]
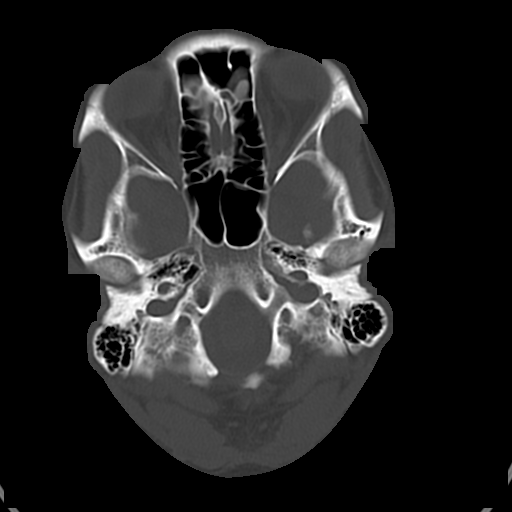
[im 7/32  bone]
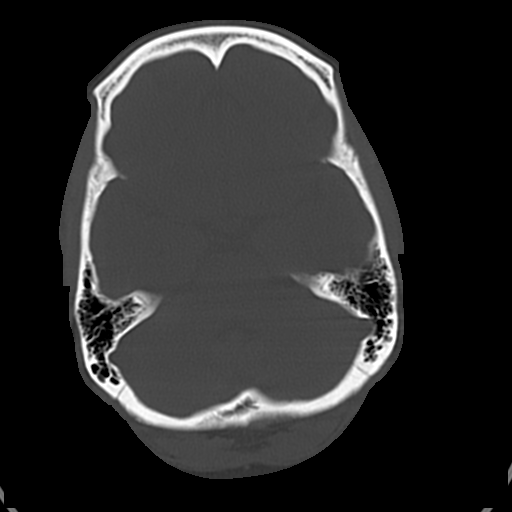
[im 12/32  bone]
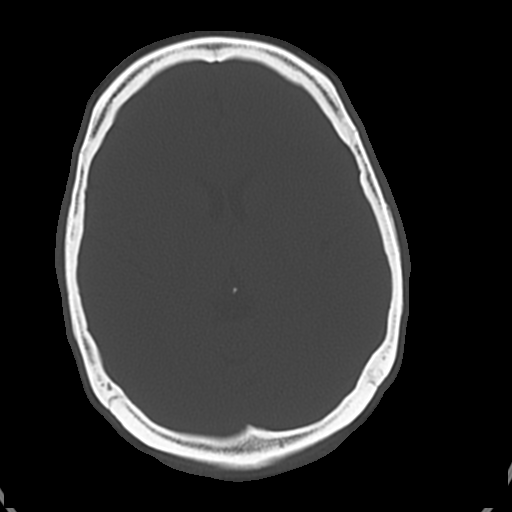

[16 of 30 positions shown; findings below may reference images not displayed]

FINDINGS: No acute cortical infarct, hemorrhage, or mass lesion ispresent.
Ventricles are of normal size. No significant extra-axial fluid
collection is present. The paranasal sinuses andmastoid air cells
are clear. The osseous skull is intact.
IMPRESSION: Normal brain.
